# Patient Record
Sex: Female | Born: 1990 | Race: White | Hispanic: No | Smoking: Former smoker
Health system: Southern US, Community
[De-identification: ages and names within clinical notes are randomized; demographics above are authoritative.]

## PROBLEM LIST (undated history)

## (undated) DIAGNOSIS — IMO0002 Reserved for concepts with insufficient information to code with codable children: Secondary | ICD-10-CM

## (undated) DIAGNOSIS — Z8619 Personal history of other infectious and parasitic diseases: Secondary | ICD-10-CM

## (undated) DIAGNOSIS — O34219 Maternal care for unspecified type scar from previous cesarean delivery: Secondary | ICD-10-CM

## (undated) DIAGNOSIS — G43909 Migraine, unspecified, not intractable, without status migrainosus: Secondary | ICD-10-CM

## (undated) DIAGNOSIS — N159 Renal tubulo-interstitial disease, unspecified: Secondary | ICD-10-CM

## (undated) DIAGNOSIS — R87629 Unspecified abnormal cytological findings in specimens from vagina: Secondary | ICD-10-CM

## (undated) HISTORY — DX: Unspecified abnormal cytological findings in specimens from vagina: R87.629

---
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
## 2009-03-16 ENCOUNTER — Emergency Department (HOSPITAL_COMMUNITY): Admission: EM | Admit: 2009-03-16 | Discharge: 2009-03-16 | Payer: Self-pay | Admitting: Emergency Medicine

## 2009-06-29 IMAGING — CR DG CHEST 1V PORT
1 series · 1 of 1 positions shown · non-contrast
Comparison: None.

CLINICAL DATA: Sepsis.  Central venous catheter placement.

PORTABLE CHEST - 1 VIEW [DATE]/6775 8763 hours:

[view not recorded]
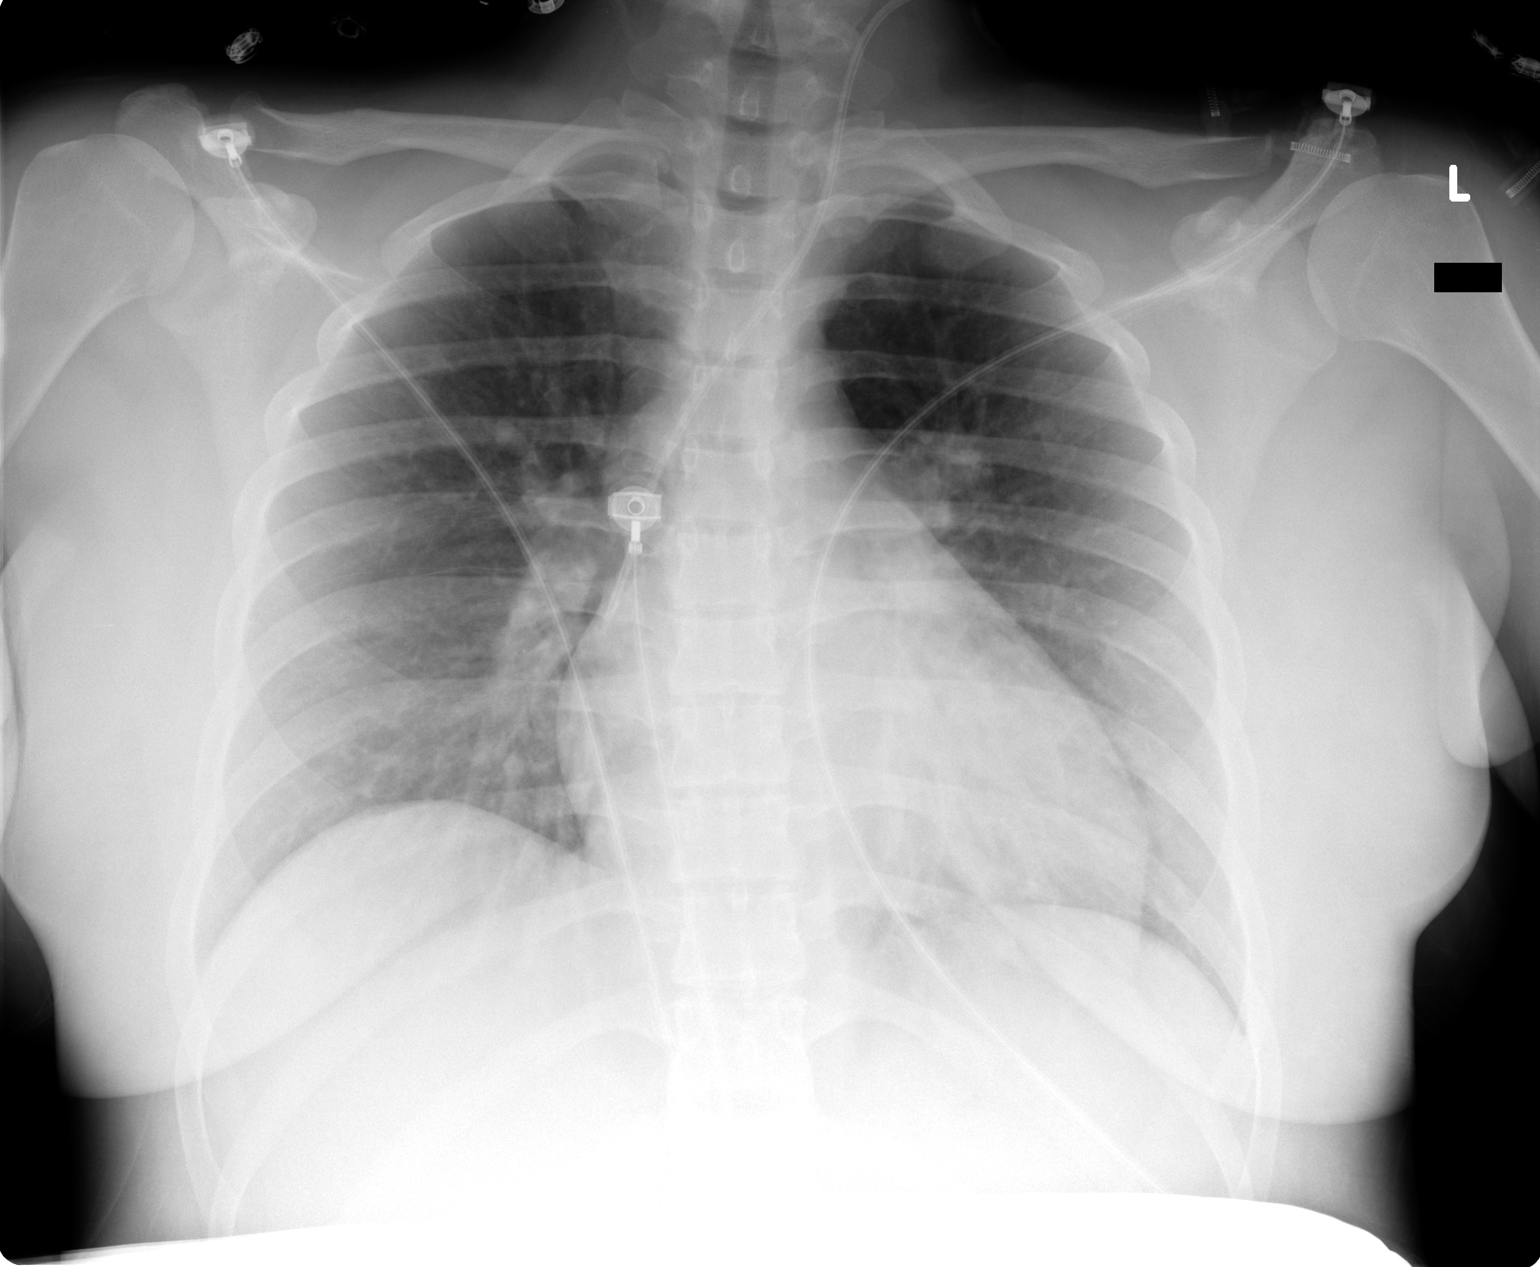

[1 of 1 positions shown; findings below may reference images not displayed]

FINDINGS: Left jugular central venous catheter tip in the lower SVC
near the cavoatrial junction.  No evidence of pneumothorax or
mediastinal hematoma.  Heart size upper normal for AP portable
technique.  Lungs clear.  No pleural effusions.
IMPRESSION: 1.  Left jugular central venous catheter tip in the lower SVC near
the cavoatrial junction.  No acute complicating features.
2.  No acute cardiopulmonary disease.

## 2009-06-29 IMAGING — CR DG CHEST 1V PORT
1 series · 1 of 1 positions shown · non-contrast
Comparison: 04/07/2008 at [DATE] a.m.

CLINICAL DATA: Sepsis.  Central venous catheter placement.

PORTABLE CHEST - 1 VIEW [DATE] p.m.

[view not recorded]
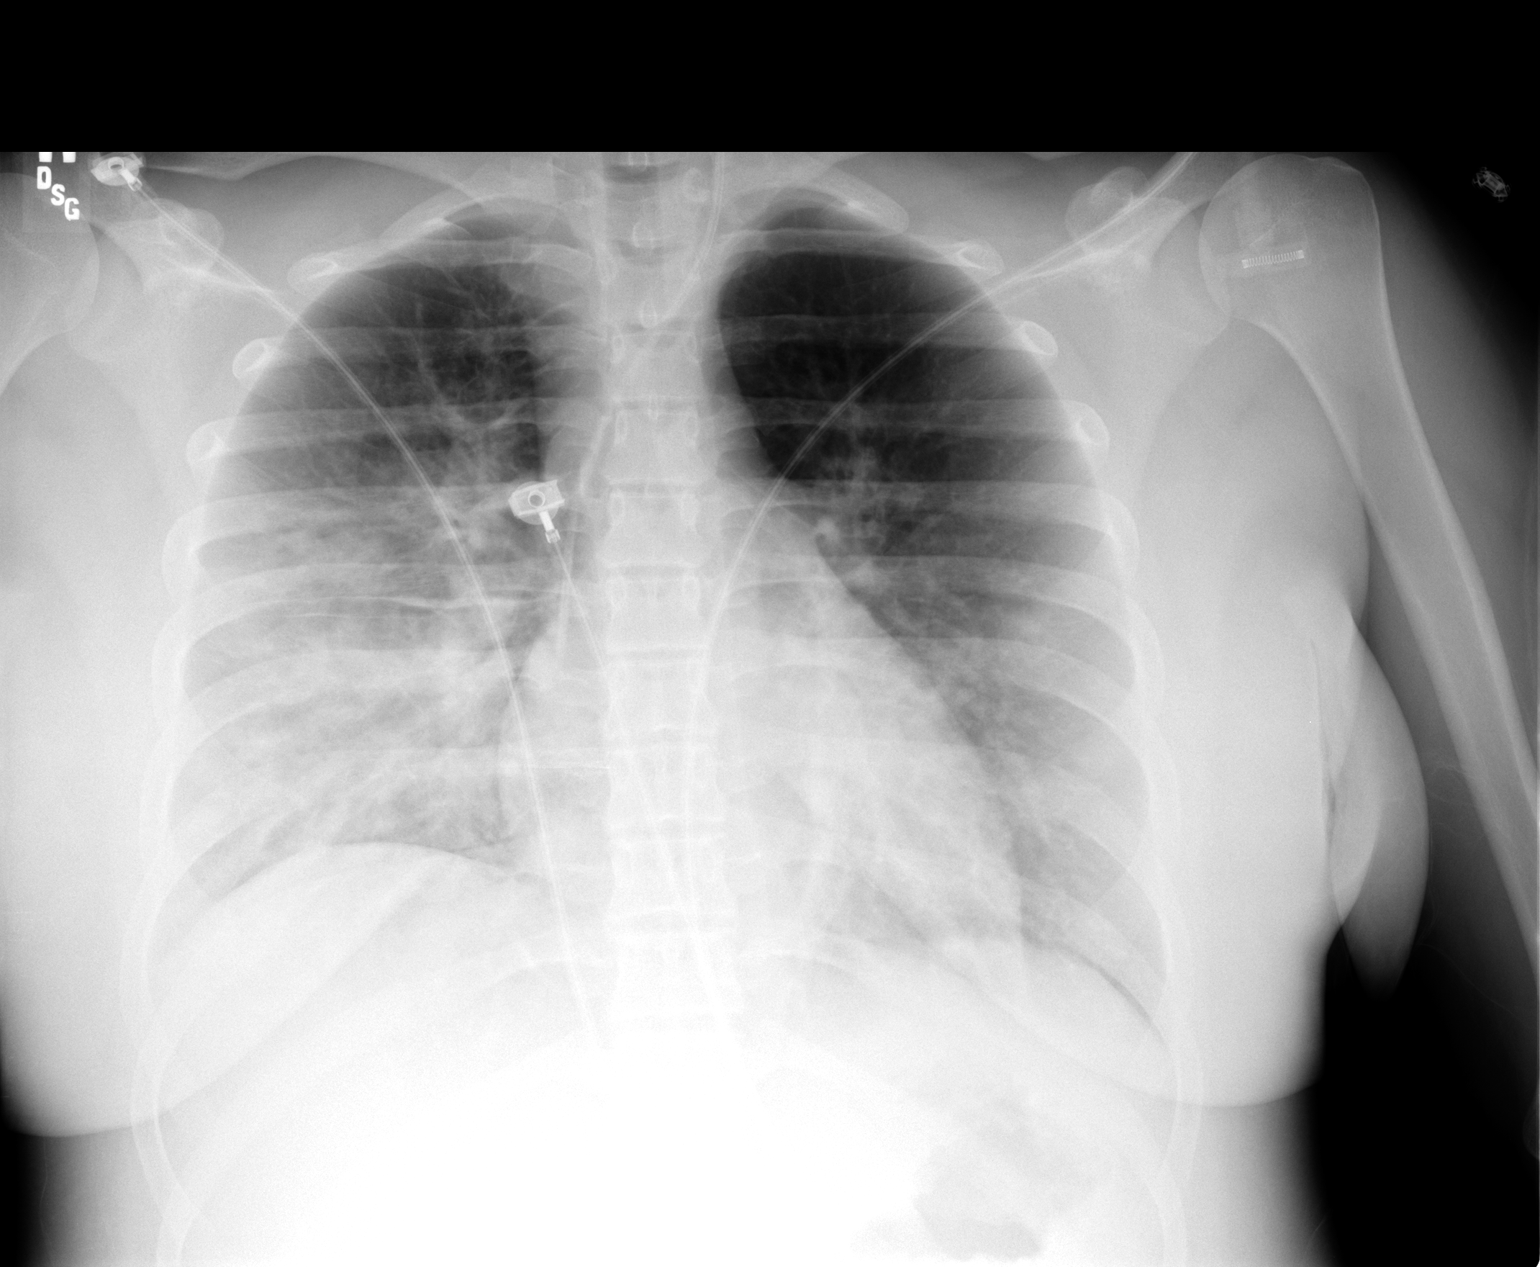

[1 of 1 positions shown; findings below may reference images not displayed]

FINDINGS: The patient has now developed bilateral pneumonia, worse
on the right than the left.  The infiltrates appear to involve
primarily the lower lobes.  Central venous catheter tip is in the
superior vena cava in good position just above the cavoatrial
junction.

The heart size and vascularity are normal.  No bony abnormality.
No pneumothorax.
IMPRESSION: Interval development of bilateral pulmonary infiltrates, probably
pneumonia.  Central venous catheter tip  is just above the
cavoatrial junction.

## 2009-06-29 IMAGING — CR DG CHEST 1V PORT
1 series · 1 of 1 positions shown · non-contrast
Comparison: 04/07/2008 and 8077 hours.

CLINICAL DATA: Fever and shortness of breath.

PORTABLE CHEST - 1 VIEW

[view not recorded]
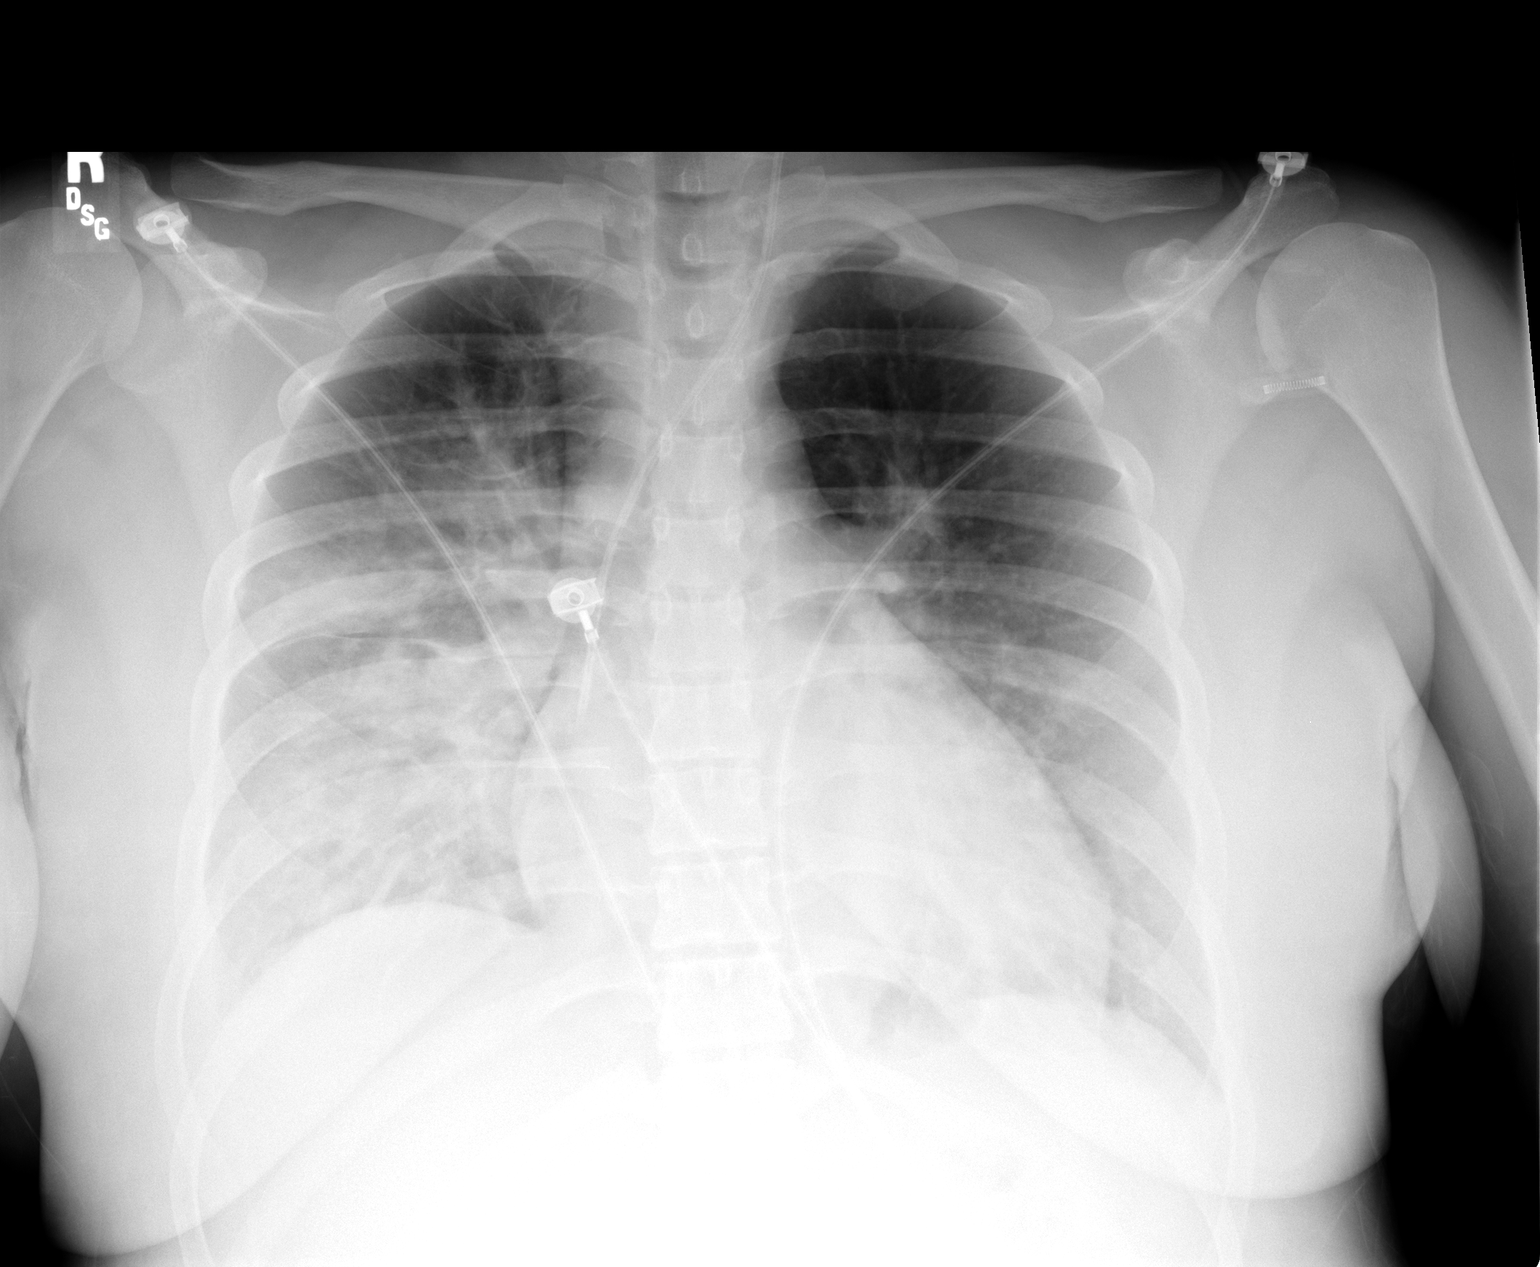

[1 of 1 positions shown; findings below may reference images not displayed]

FINDINGS: The left IJ catheter is stable.  Persistent bilateral
infiltrates.  Probable small right effusion.
IMPRESSION: 1.  Stable chest x-ray.  Persistent bilateral infiltrates and
probable small right effusion.

## 2009-06-30 IMAGING — CR DG CHEST 1V PORT
1 series · 1 of 1 positions shown · non-contrast
Comparison: 04/07/2008

CLINICAL DATA: Fever and headache

PORTABLE CHEST - 1 VIEW

[view not recorded]
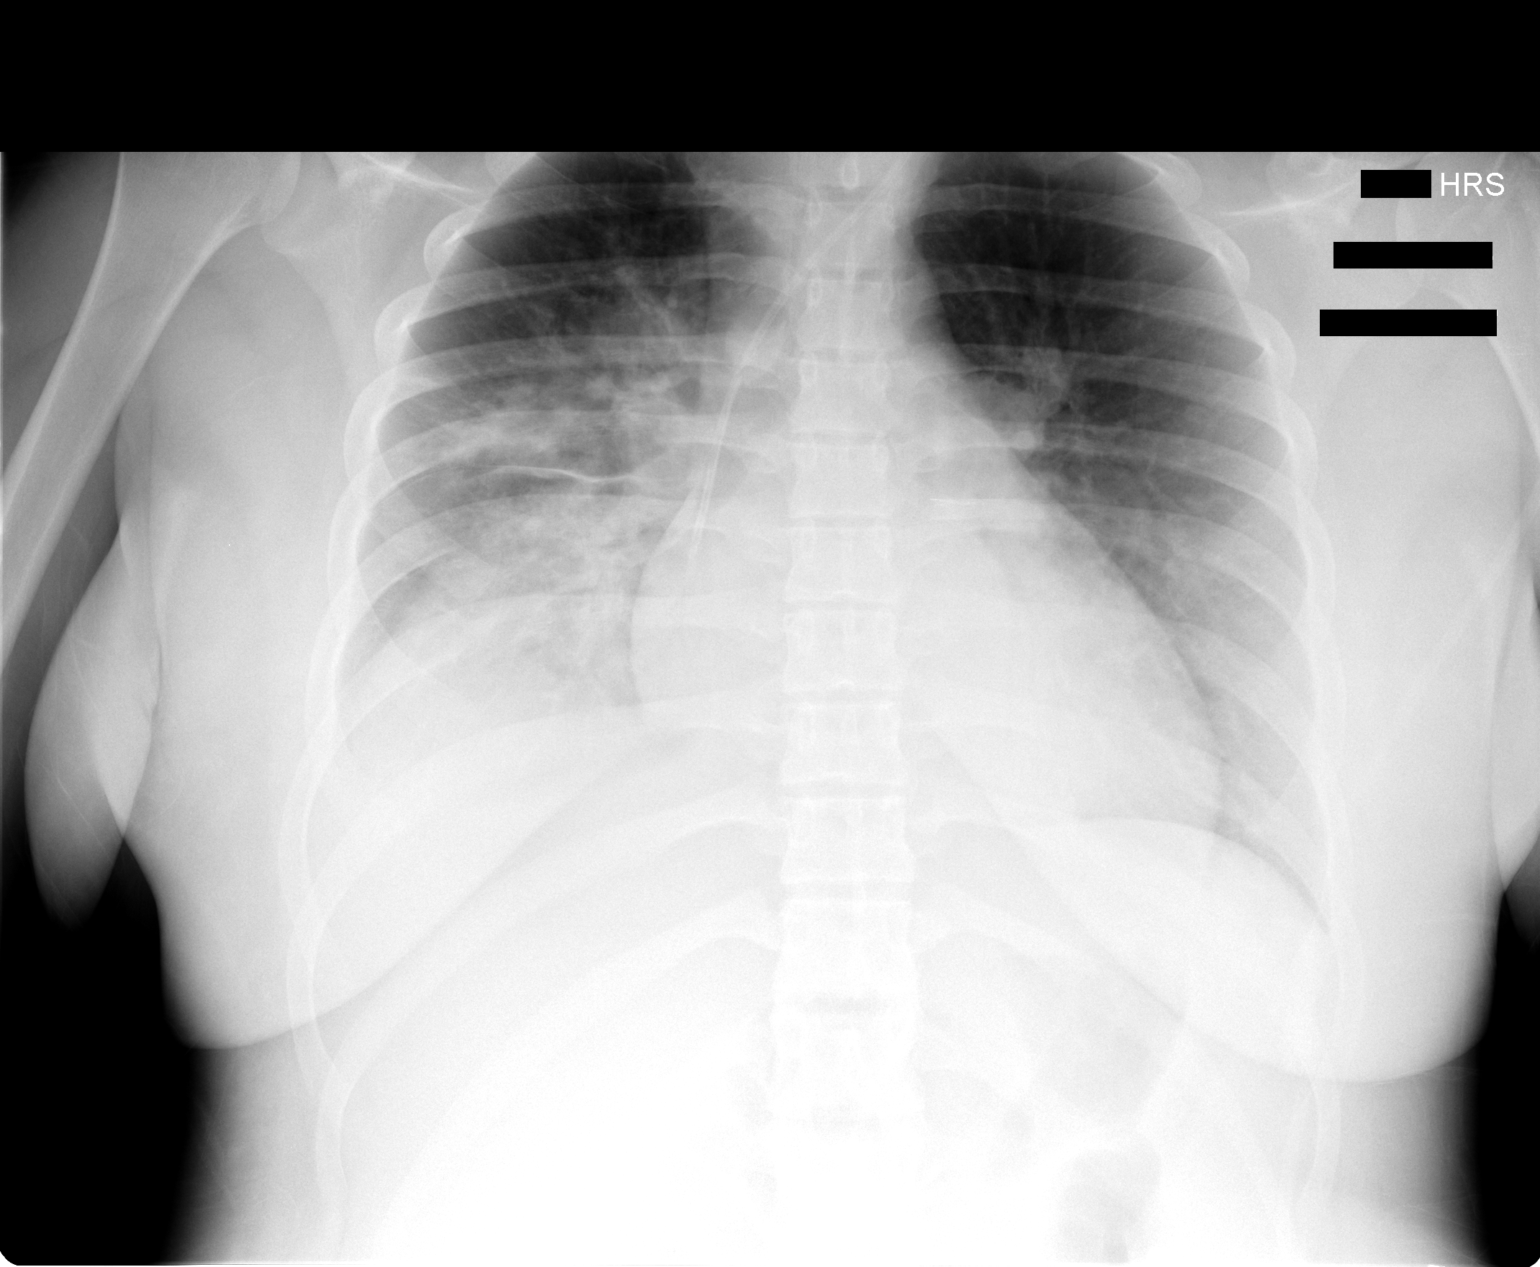

[1 of 1 positions shown; findings below may reference images not displayed]

FINDINGS: Portable upright view of the chest demonstrates airspace
opacification occupying the right middle and lower lung region.
Findings suggestive for pneumonia.  There is a left jugular central
venous catheter with the tip in the SVC.  Left lung is clear.  The
heart and mediastinum are stable.  There is stable fullness in the
right paratracheal region and cannot exclude adenopathy in this
area.  Increased opacification in the right lung base with
obscuration of the right hemidiaphragm. Difficult to exclude
airspace disease in the retrocardiac space.
IMPRESSION: Persistent airspace disease throughout the right mid and lower lung
region.  Increased density in the right lung base and cannot
exclude pleural fluid.

## 2009-10-12 ENCOUNTER — Other Ambulatory Visit: Payer: Self-pay | Admitting: Obstetrics and Gynecology

## 2009-10-16 ENCOUNTER — Inpatient Hospital Stay (HOSPITAL_COMMUNITY): Admission: RE | Admit: 2009-10-16 | Discharge: 2009-10-19 | Payer: Self-pay | Admitting: Obstetrics and Gynecology

## 2009-10-23 ENCOUNTER — Inpatient Hospital Stay (HOSPITAL_COMMUNITY): Admission: AD | Admit: 2009-10-23 | Discharge: 2009-10-23 | Payer: Self-pay | Admitting: Obstetrics and Gynecology

## 2010-07-05 ENCOUNTER — Emergency Department (HOSPITAL_COMMUNITY): Admission: EM | Admit: 2010-07-05 | Discharge: 2010-07-05 | Payer: Self-pay | Admitting: Emergency Medicine

## 2010-12-30 LAB — URINE CULTURE: Colony Count: 60000

## 2010-12-30 LAB — URINE MICROSCOPIC-ADD ON

## 2010-12-30 LAB — CROSSMATCH: ABO/RH(D): O NEG

## 2010-12-30 LAB — URINALYSIS, ROUTINE W REFLEX MICROSCOPIC
Ketones, ur: 15 mg/dL — AB
Nitrite: NEGATIVE
pH: 6 (ref 5.0–8.0)

## 2010-12-30 LAB — CBC
HCT: 26.8 % — ABNORMAL LOW (ref 36.0–46.0)
Hemoglobin: 9.3 g/dL — ABNORMAL LOW (ref 12.0–15.0)
MCHC: 34.7 g/dL (ref 30.0–36.0)
MCV: 86.6 fL (ref 78.0–100.0)
MCV: 87.9 fL (ref 78.0–100.0)
Platelets: 474 10*3/uL — ABNORMAL HIGH (ref 150–400)
RBC: 3.1 MIL/uL — ABNORMAL LOW (ref 3.87–5.11)
WBC: 10.9 10*3/uL — ABNORMAL HIGH (ref 4.0–10.5)
WBC: 11.9 10*3/uL — ABNORMAL HIGH (ref 4.0–10.5)

## 2010-12-30 LAB — RH IMMUNE GLOB WKUP(>/=20WKS)(NOT WOMEN'S HOSP)

## 2010-12-30 LAB — CCBB MATERNAL DONOR DRAW

## 2011-01-14 LAB — RPR: RPR Ser Ql: NONREACTIVE

## 2011-01-14 LAB — CBC
HCT: 31.2 % — ABNORMAL LOW (ref 36.0–46.0)
Platelets: 244 10*3/uL (ref 150–400)
WBC: 10.3 10*3/uL (ref 4.0–10.5)

## 2011-01-21 LAB — POCT URINALYSIS DIP (DEVICE)
Protein, ur: 30 mg/dL — AB
Urobilinogen, UA: 0.2 mg/dL (ref 0.0–1.0)

## 2011-01-21 LAB — POCT RAPID STREP A (OFFICE): Streptococcus, Group A Screen (Direct): NEGATIVE

## 2011-01-21 LAB — POCT INFECTIOUS MONO SCREEN: Mono Screen: NEGATIVE

## 2011-02-26 NOTE — Op Note (Signed)
NAME:  Dana Snyder, Dana Snyder               ACCOUNT NO.:  [id number]   MEDICAL RECORD NO.:  [id number]          PATIENT TYPE:  AMB   LOCATION:  DSC                          FACILITY:  [facility]   PHYSICIAN:  Mila Homer. Sherlean Foot, M.D. DATE OF BIRTH:  01/16/1991   DATE OF PROCEDURE:  07/15/2007  DATE OF DISCHARGE:                               OPERATIVE REPORT   SURGEON:  Mila Homer. Sherlean Foot, M.D.   ASSISTANT:  None.   ANESTHESIA:  General.   PREOPERATIVE DIAGNOSIS:  Right knee internal derangement, probable loose  body.   POSTOPERATIVE DIAGNOSIS:  Right knee plica syndrome.   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  However, she was having mechanical symptoms.  Informed consent was obtained from the mom.   DESCRIPTION OF PROCEDURE:  The patient was laid supine and administered  general LMA anesthesia.  The right leg was prepped and draped in the  usual sterile fashion.  Inferolateral and inferomedial portals were  created with #11 blade, blunt trocar and cannula.  Diagnostic  arthroscopy revealed no chondromalacia in the joint whatsoever.  I took  a tour through all 3 compartments and the only thing that I could find  was some relatively significant medial parapatellar plica, which was  impinging all the way into the patella.  I debrided this and obtained  hemostasis.  I searched in the medial and lateral fat pad for any hard  loose bodies, both under direct visualization through the scope as well  as palpation with a probe and with my finger.  I could not find any  remnant of a loose body.  I went into the back of the knee past the PCL  and did not find anything there either.  I then went into all 3  compartments including the gutters, no loose bodies, no other pathology  other than the plica,  which I had debrided.  I then irrigated and closed  with 4-0 nylon sutures, dressed with a Xeroform dressing sponges,  sterile Webril and Ace wrap, infiltrated with 10 mL of Marcaine and  morphine mixture.           ______________________________  Mila Homer Sherlean Foot, M.D.     SDL/MEDQ  D:  07/15/2007  T:  07/16/2007  Job:  [id number]

## 2011-02-26 NOTE — Discharge Summary (Signed)
NAME:  Dana Snyder, Dana Snyder               ACCOUNT NO.:  [id number]   MEDICAL RECORD NO.:  [id number]           PATIENT TYPE:   LOCATION:                                 FACILITY:   PHYSICIAN:  Tanya S. Shawnie Pons, M.D.   DATE OF BIRTH:  1991/06/13   DATE OF ADMISSION:  04/06/2008  DATE OF DISCHARGE:  04/06/2008                               DISCHARGE SUMMARY   FINAL DIAGNOSES:  1. Intrauterine pregnancy at 24 weeks.  2. Pyelonephritis.  3. Sepsis and acute respiratory distress syndrome.   PERTINENT LABORATORY VALUES:  Urinalysis that showed 100 of protein, 26  leuke esterase with 7 to 10 wbc's, and many bacteria.  White blood cell  count of 23.3 and hemoglobin 11.5.  Electrolytes were essentially  normal.  Urine culture was negative.  Blood cultures were negative x2.  Chest x-ray that showed right-sided airspace disease.  Her x-ray showed  bilateral pulmonary infiltrate from her first x-ray.  Her last x-ray  showed just right side, resolution of the left.   PROCEDURES:  The patient had an A line placed.  She also had external  jugular central line placed.   CONSULTANTS:  Critical care medicine.   Reason for admission, briefly please see the H&P on the chart.   The patient is a 20 year old G1, who is at 24 weeks, who gets [facility].  The patient went to [facility] for fever, who is diagnosed with kidney infection, sent  home on Macrobid.  She was taken approximately 3 doses, when she  developed increasing fever and so she came in at this point to the  hospital.  The patient was admitted and started on ampicillin and  gentamicin because of her allergy to cephalosporins, the patient then  came in to the hospital.   HOSPITAL COURSE:  The patient was started on ampicillin and gentamicin  on April 06, 2008, this is continuing until the next day, when the  patient dropped her blood pressure until the 70 to 40s, at that point  she was transfer to  the ICU and she was switched to Zosyn and  gentamicin.  Critical care medicine was consulted.  She then developed  some difficulty breathing.  Chest x-ray shows as above.  She was treated  with oxygen and put on CPAP and BiPAP, lines were placed, sepsis  protocol was initiated.  After this, the patient became did really well.  She had lots of fluid and got Lasix x1, but then was able to wean off  oxygen.  Her central lines were discontinued.  She was placed on p.o.  medications.  Urine culture was negative and in the absence of that she  should be on bacteriocidal medicine.  She is started on Septra double-  strength 1 p.o. b.i.d. prior to discharge.  The patient was tolerating  this and was on all oral on the day prior to discharge.  The patient had  been afebrile for 3 days in the hospital.   DISCHARGE DISPOSITION AND CONDITION:  The patient is discharged home  in  improved condition.  Followup will be in [facility], Eulah Pont  will call her with this appointment.  She is also instructed to return  for nausea, vomiting, inability to keep down her medications, or repeat  of febrile-like illness.   DISCHARGE MEDICATIONS:  Septra 1 p.o. b.i.d. of the double strength  variety x11 days to complete 14-day course of antibiotics.      Shelbie Proctor. Shawnie Pons, M.D.  Electronically Signed     TSP/MEDQ  D:  04/10/2008  T:  04/10/2008  Job:  [id number]

## 2011-02-26 NOTE — Discharge Summary (Signed)
NAME:  Dana Snyder, Dana Snyder NO.:  [id number]   MEDICAL RECORD NO.:  [id number]           PATIENT TYPE:   LOCATION:                                 FACILITY:   PHYSICIAN:  Lesly Dukes, M.D.      DATE OF BIRTH:   DATE OF ADMISSION:  07/16/2008  DATE OF DISCHARGE:  10/20/2007                               DISCHARGE SUMMARY   REASON FOR HOSPITALIZATION:  Premature rupture of membranes at 58 and 4  weeks.   HOSPITAL COURSE:  The patient is an 20 year old G1, P0, admitted for  premature rupture of membranes at 60 and 4 weeks.  She was admitted to  Labor and Delivery.  Throughout course of labor, she was treated with  antibiotics for PROM greater than 24 hours.  Her labor was augmented  with Cytotec and Pitocin.  The patient delivered at 9:45 on June 17, 2008.  She delivered a viable female by spontaneous vaginal delivery under  epidural anesthesia.  The infant was bulb suctioned at delivery.  Nuchal  cord x1 was present.  It was loose and infant was delivered through.  The patient had a second-degree perineal laceration repaired with 2-0  Vicryl suture.  Estimated blood loss of 300 mL.  The patient and infant  stable.  On July 16, 2008, the patient had a hemoglobin of 10.7 and  hematocrit of 30.4.  The patient's blood type is O negative and baby's  blood type was O positive.  Fetal bleeding negative.  The patient  received 300 mcg of RhoGAM.  The patient is rubella immune, RPR  nonreactive, and HIV negative.  By the day of discharge, the patient's  vital signs were stable.  The patient and the baby will be discharged  with followup with the mother [facility].  The baby  to be seen at [facility] on July 20, 2008, at  2:15.   DISCHARGE MEDICATIONS:  1. Prenatal vitamin take 1 by mouth daily while breastfeeding.  2. Ibuprofen 600 mg 1 by mouth every 6 hours as needed for pain.  3. Colace 100 mg twice a day.  4.  Percocet 5/325 mg take 1 p.o. q.4-6 h. as needed for pain.   DISCHARGE DIAGNOSES:  1. Spontaneous vaginal delivery at 38 and 5 weeks.  2. Premature rupture of membrane.   DISCHARGE CONDITION:  Stable.   FOLLOWUP:  [facility] appointment at the [facility] in 6 weeks.   The patient instructed to have pelvic rest and no heavy lifting for 6  weeks.      Delbert Harness, MD      Lesly Dukes, M.D.  Electronically Signed    KB/MEDQ  D:  07/19/2008  T:  07/20/2008  Job:  [id number]

## 2011-03-07 ENCOUNTER — Emergency Department (HOSPITAL_COMMUNITY)
Admission: EM | Admit: 2011-03-07 | Discharge: 2011-03-07 | Discharge status: Against Medical Advice | Payer: Self-pay | Attending: Emergency Medicine | Admitting: Emergency Medicine

## 2011-03-07 DIAGNOSIS — R51 Headache: Secondary | ICD-10-CM | POA: Insufficient documentation

## 2011-03-07 DIAGNOSIS — R509 Fever, unspecified: Secondary | ICD-10-CM | POA: Insufficient documentation

## 2011-03-07 DIAGNOSIS — R1110 Vomiting, unspecified: Secondary | ICD-10-CM | POA: Insufficient documentation

## 2011-03-07 LAB — RAPID STREP SCREEN (MED CTR MEBANE ONLY): Streptococcus, Group A Screen (Direct): NEGATIVE

## 2011-03-08 ENCOUNTER — Emergency Department (HOSPITAL_COMMUNITY)
Admission: EM | Admit: 2011-03-08 | Discharge: 2011-03-08 | Disposition: A | Discharge status: Home / Self Care | Payer: Self-pay | Attending: Emergency Medicine | Admitting: Emergency Medicine

## 2011-03-08 DIAGNOSIS — J039 Acute tonsillitis, unspecified: Secondary | ICD-10-CM | POA: Insufficient documentation

## 2011-03-08 LAB — DIFFERENTIAL
Basophils Absolute: 0 10*3/uL (ref 0.0–0.1)
Eosinophils Absolute: 0 10*3/uL (ref 0.0–0.7)
Lymphs Abs: 1.5 10*3/uL (ref 0.7–4.0)
Neutro Abs: 12.1 10*3/uL — ABNORMAL HIGH (ref 1.7–7.7)

## 2011-03-08 LAB — CBC
MCH: 30.5 pg (ref 26.0–34.0)
MCHC: 33.9 g/dL (ref 30.0–36.0)
Platelets: 226 10*3/uL (ref 150–400)

## 2011-03-08 LAB — MONONUCLEOSIS SCREEN: Mono Screen: NEGATIVE

## 2011-07-11 LAB — COMPREHENSIVE METABOLIC PANEL
ALT: 15
AST: 15
Albumin: 2.2 — ABNORMAL LOW
Calcium: 8 — ABNORMAL LOW
Chloride: 103
Creatinine, Ser: 0.74
GFR calc Af Amer: >60
Sodium: 132 — ABNORMAL LOW

## 2011-07-11 LAB — DIFFERENTIAL
Basophils Absolute: 0
Basophils Relative: 0
Eosinophils Absolute: 0
Eosinophils Relative: 0
Lymphocytes Relative: 4 — ABNORMAL LOW
Lymphs Abs: 0.7
Monocytes Absolute: 0.8
Monocytes Absolute: 1.1 — ABNORMAL HIGH
Neutro Abs: 14.5 — ABNORMAL HIGH
Neutrophils Relative %: 91 — ABNORMAL HIGH

## 2011-07-11 LAB — BASIC METABOLIC PANEL
BUN: 4 — ABNORMAL LOW
BUN: 6
CO2: 23
CO2: 24
Calcium: 7.6 — ABNORMAL LOW
Calcium: 8.2 — ABNORMAL LOW
Calcium: 9
Chloride: 104
Chloride: 105
Chloride: 108
Creatinine, Ser: 0.71
Creatinine, Ser: 0.76
Creatinine, Ser: 0.78
GFR calc Af Amer: >60
GFR calc Af Amer: >60
GFR calc Af Amer: >60
GFR calc Af Amer: >60
GFR calc non Af Amer: >60
Glucose, Bld: 83
Glucose, Bld: 83
Potassium: 3.5
Potassium: 3.5
Sodium: 133 — ABNORMAL LOW

## 2011-07-11 LAB — CBC
HCT: 25.5 — ABNORMAL LOW
HCT: 26.2 — ABNORMAL LOW
HCT: 33.2 — ABNORMAL LOW
Hemoglobin: 11.5 — ABNORMAL LOW
MCHC: 35.2
MCHC: 35.6
MCV: 91.5
MCV: 92.3
MCV: 93.5
Platelets: 193
Platelets: 193
RBC: 2.66 — ABNORMAL LOW
RBC: 2.78 — ABNORMAL LOW
RBC: 2.84 — ABNORMAL LOW
RBC: 3.58 — ABNORMAL LOW
RDW: 12.6
RDW: 12.8
WBC: 13.5 — ABNORMAL HIGH
WBC: 17 — ABNORMAL HIGH

## 2011-07-11 LAB — LEGIONELLA ANTIGEN, URINE: Legionella Antigen, Urine: NEGATIVE

## 2011-07-11 LAB — PROTIME-INR
INR: 1.1
Prothrombin Time: 14.8

## 2011-07-11 LAB — URINALYSIS, ROUTINE W REFLEX MICROSCOPIC
Ketones, ur: >80 — AB
Nitrite: NEGATIVE
pH: 5.5

## 2011-07-11 LAB — URINE CULTURE: Colony Count: >=100000

## 2011-07-11 LAB — CULTURE, BLOOD (ROUTINE X 2): Culture: NO GROWTH

## 2011-07-11 LAB — BLOOD GAS, ARTERIAL
Bicarbonate: 18.9 — ABNORMAL LOW
O2 Saturation: 99
TCO2: 19.7

## 2011-07-11 LAB — APTT: aPTT: 33

## 2011-07-11 LAB — CARBOXYHEMOGLOBIN
Carboxyhemoglobin: 0.8
Carboxyhemoglobin: 1.2
Methemoglobin: 0.8
O2 Saturation: 86

## 2011-07-11 LAB — MAGNESIUM: Magnesium: 1.8

## 2011-07-11 LAB — URINE MICROSCOPIC-ADD ON

## 2011-07-11 LAB — STREP PNEUMONIAE URINARY ANTIGEN: Strep Pneumo Urinary Antigen: NEGATIVE

## 2011-07-11 LAB — ABO/RH: ABO/RH(D): O NEG

## 2011-07-12 LAB — POCT URINALYSIS DIP (DEVICE)
Hgb urine dipstick: NEGATIVE
Operator id: 297281
Operator id: 297281
Protein, ur: NEGATIVE
Protein, ur: NEGATIVE
Specific Gravity, Urine: 1.01
Specific Gravity, Urine: 1.01
Urobilinogen, UA: 0.2
Urobilinogen, UA: 0.2
pH: 7
pH: 6

## 2011-07-15 LAB — RH IMMUNE GLOB WKUP(>/=20WKS)(NOT WOMEN'S HOSP): Fetal Screen: NEGATIVE

## 2011-07-15 LAB — CBC
HCT: 30.4 — ABNORMAL LOW
Hemoglobin: 11.1 — ABNORMAL LOW
MCHC: 35.3
MCV: 88.4
Platelets: 224
Platelets: 426 — ABNORMAL HIGH
RBC: 3.44 — ABNORMAL LOW
RDW: 13.3

## 2011-07-15 LAB — URINALYSIS, ROUTINE W REFLEX MICROSCOPIC
Nitrite: NEGATIVE
Specific Gravity, Urine: 1.025
pH: 6

## 2011-07-15 LAB — URINE CULTURE: Colony Count: >=100000

## 2011-07-15 LAB — GC/CHLAMYDIA PROBE AMP, URINE
Chlamydia, Swab/Urine, PCR: NEGATIVE
GC Probe Amp, Urine: NEGATIVE

## 2011-07-15 LAB — POCT URINALYSIS DIP (DEVICE)
Hgb urine dipstick: NEGATIVE
Hgb urine dipstick: NEGATIVE
Hgb urine dipstick: NEGATIVE
Ketones, ur: NEGATIVE
Ketones, ur: NEGATIVE
Ketones, ur: NEGATIVE
Operator id: 297281
Protein, ur: NEGATIVE
Protein, ur: NEGATIVE
Protein, ur: NEGATIVE
Specific Gravity, Urine: 1.01
Specific Gravity, Urine: <=1.005
Specific Gravity, Urine: 1.015
Urobilinogen, UA: 0.2
Urobilinogen, UA: 1
Urobilinogen, UA: 0.2
pH: 6.5

## 2011-07-15 LAB — RPR: RPR Ser Ql: NONREACTIVE

## 2011-07-15 LAB — URINE MICROSCOPIC-ADD ON

## 2011-07-15 LAB — DIFFERENTIAL
Basophils Absolute: 0.1
Lymphocytes Relative: 29
Neutro Abs: 5.5

## 2011-07-17 LAB — POCT URINALYSIS DIP (DEVICE)
Nitrite: NEGATIVE
Protein, ur: NEGATIVE
pH: 6.5

## 2011-07-22 ENCOUNTER — Emergency Department (HOSPITAL_COMMUNITY)
Admission: EM | Admit: 2011-07-22 | Discharge: 2011-07-22 | Disposition: A | Discharge status: Home / Self Care | Payer: Self-pay | Attending: Emergency Medicine | Admitting: Emergency Medicine

## 2011-07-22 DIAGNOSIS — L0211 Cutaneous abscess of neck: Secondary | ICD-10-CM | POA: Insufficient documentation

## 2011-07-25 LAB — CULTURE, ROUTINE-ABSCESS: Gram Stain: NONE SEEN

## 2011-07-25 LAB — POCT HEMOGLOBIN-HEMACUE
Hemoglobin: 14.4
Operator id: 116011

## 2011-11-12 ENCOUNTER — Emergency Department (HOSPITAL_COMMUNITY)
Admission: EM | Admit: 2011-11-12 | Discharge: 2011-11-13 | Disposition: A | Discharge status: Home / Self Care | Payer: Self-pay | Attending: Emergency Medicine | Admitting: Emergency Medicine

## 2011-11-12 ENCOUNTER — Encounter (HOSPITAL_COMMUNITY): Payer: Self-pay | Admitting: *Deleted

## 2011-11-12 DIAGNOSIS — R1011 Right upper quadrant pain: Secondary | ICD-10-CM | POA: Insufficient documentation

## 2011-11-12 DIAGNOSIS — R350 Frequency of micturition: Secondary | ICD-10-CM | POA: Insufficient documentation

## 2011-11-12 DIAGNOSIS — N12 Tubulo-interstitial nephritis, not specified as acute or chronic: Secondary | ICD-10-CM | POA: Insufficient documentation

## 2011-11-12 DIAGNOSIS — R109 Unspecified abdominal pain: Secondary | ICD-10-CM | POA: Insufficient documentation

## 2011-11-12 DIAGNOSIS — M25519 Pain in unspecified shoulder: Secondary | ICD-10-CM | POA: Insufficient documentation

## 2011-11-12 DIAGNOSIS — R110 Nausea: Secondary | ICD-10-CM | POA: Insufficient documentation

## 2011-11-12 DIAGNOSIS — F17200 Nicotine dependence, unspecified, uncomplicated: Secondary | ICD-10-CM | POA: Insufficient documentation

## 2011-11-12 NOTE — ED Notes (Signed)
rlq pain since Thursday and it radistes into her rt shoulder.  She was ill with the flu one week previously.  lmp now

## 2011-11-13 ENCOUNTER — Emergency Department (HOSPITAL_COMMUNITY): Payer: Self-pay

## 2011-11-13 ENCOUNTER — Encounter (HOSPITAL_COMMUNITY): Payer: Self-pay | Admitting: *Deleted

## 2011-11-13 LAB — CBC
HCT: 32.6 % — ABNORMAL LOW (ref 36.0–46.0)
Hemoglobin: 11.2 g/dL — ABNORMAL LOW (ref 12.0–15.0)
MCV: 89.6 fL (ref 78.0–100.0)
RBC: 3.64 MIL/uL — ABNORMAL LOW (ref 3.87–5.11)
RDW: 12.1 % (ref 11.5–15.5)
WBC: 11.2 10*3/uL — ABNORMAL HIGH (ref 4.0–10.5)

## 2011-11-13 LAB — LIPASE, BLOOD: Lipase: 11 U/L (ref 11–59)

## 2011-11-13 LAB — URINALYSIS, ROUTINE W REFLEX MICROSCOPIC
Ketones, ur: NEGATIVE mg/dL
Protein, ur: NEGATIVE mg/dL
Urobilinogen, UA: 0.2 mg/dL (ref 0.0–1.0)

## 2011-11-13 LAB — COMPREHENSIVE METABOLIC PANEL
Alkaline Phosphatase: 49 U/L (ref 39–117)
BUN: 8 mg/dL (ref 6–23)
CO2: 25 mEq/L (ref 19–32)
Chloride: 101 mEq/L (ref 96–112)
Creatinine, Ser: 0.89 mg/dL (ref 0.50–1.10)
GFR calc Af Amer: >90 mL/min (ref >90)
GFR calc non Af Amer: >90 mL/min (ref >90)
Glucose, Bld: 101 mg/dL — ABNORMAL HIGH (ref 70–99)
Potassium: 3.7 mEq/L (ref 3.5–5.1)
Total Bilirubin: 0.3 mg/dL (ref 0.3–1.2)

## 2011-11-13 LAB — URINE MICROSCOPIC-ADD ON

## 2011-11-13 MED ORDER — OXYCODONE-ACETAMINOPHEN 5-325 MG PO TABS: 2.0000 | ORAL_TABLET | ORAL | Status: AC | PRN

## 2011-11-13 MED ORDER — KETOROLAC TROMETHAMINE 30 MG/ML IJ SOLN
30.0000 mg | Freq: Once | INTRAMUSCULAR | Status: AC
  Administered 2011-11-13: 30 mg via INTRAVENOUS
  Filled 2011-11-13: qty 1

## 2011-11-13 MED ORDER — SODIUM CHLORIDE 0.9 % IV BOLUS (SEPSIS)
1000.0000 mL | Freq: Once | INTRAVENOUS | Status: AC
  Administered 2011-11-13: 1000 mL via INTRAVENOUS

## 2011-11-13 MED ORDER — CEPHALEXIN 500 MG PO CAPS: 500.0000 mg | ORAL_CAPSULE | Freq: Four times a day (QID) | ORAL | Status: AC

## 2011-11-13 MED ORDER — PROMETHAZINE HCL 25 MG PO TABS: 25.0000 mg | ORAL_TABLET | Freq: Four times a day (QID) | ORAL | Status: AC | PRN

## 2011-11-13 MED ORDER — DEXTROSE 5 % IV SOLN
1.0000 g | INTRAVENOUS | Status: DC
  Administered 2011-11-13: 1 g via INTRAVENOUS
  Filled 2011-11-13: qty 10

## 2011-11-13 MED ORDER — MORPHINE SULFATE 4 MG/ML IJ SOLN
4.0000 mg | Freq: Once | INTRAMUSCULAR | Status: AC
  Administered 2011-11-13: 4 mg via INTRAVENOUS
  Filled 2011-11-13: qty 1

## 2011-11-13 MED ORDER — ONDANSETRON HCL 4 MG/2ML IJ SOLN
4.0000 mg | Freq: Once | INTRAMUSCULAR | Status: AC
  Administered 2011-11-13: 4 mg via INTRAVENOUS
  Filled 2011-11-13: qty 2

## 2011-11-13 MED ORDER — HYDROMORPHONE HCL PF 1 MG/ML IJ SOLN
1.0000 mg | Freq: Once | INTRAMUSCULAR | Status: AC
  Administered 2011-11-13: 1 mg via INTRAVENOUS
  Filled 2011-11-13: qty 1

## 2011-11-13 NOTE — ED Notes (Signed)
Pt in US

## 2011-11-13 NOTE — ED Notes (Addendum)
Pt states that she has been having r upper and lower quadrant pain. Pt stated that the pain started Thursday night. Pain level is 8 out of 10. Previously nauseated but currently none. LBM 11/12/11. Bowel Sounds present. No frequency, burining, or itching with urination

## 2011-11-13 NOTE — ED Provider Notes (Signed)
History     CSN: [id number]  Arrival date & time 11/12/11  2239   First MD Initiated Contact with Patient 11/13/11 0036      Chief Complaint  Patient presents with  . Abdominal Pain    (Consider location/radiation/quality/duration/timing/severity/associated sxs/prior treatment) Patient is a 21 y.o. female presenting with abdominal pain. The history is provided by the patient.  Abdominal Pain The primary symptoms of the illness include abdominal pain.   the patient reports development of right upper quadrant abdominal pain as well as right flank pain several days ago.  The pain now radiates into her right shoulder.  She did have flulike illness approximately one week ago.  She reports nausea without vomiting.  She's currently on her menstrual cycle.  She does have urinary frequency.  She denies dysuria and urinary retention.  She has no prior history of ureteral lithiasis.  There is no radiation of her pain into her lower abdomen.  She does not drink alcohol not daily basis.  She reports she's had a history of urinary tract infection and kidney infections before.  She has no prior history of gallstones or biliary colic.  Her pain is not worsened by food.  Her pain is constant.  It is worsened by movement and palpation.  She reports anorexia with decreased by mouth intake.  History reviewed. No pertinent past medical history.  History reviewed. No pertinent past surgical history.  History reviewed. No pertinent family history.  History  Substance Use Topics  . Smoking status: Current Everyday Smoker  . Smokeless tobacco: Not on file  . Alcohol Use: Yes    OB History    Grav Para Term Preterm Abortions TAB SAB Ect Mult Living                  Review of Systems  Gastrointestinal: Positive for abdominal pain.  All other systems reviewed and are negative.    Allergies  Darvocet  Home Medications  No current outpatient prescriptions on file.  BP 98/66  Pulse 84   Temp(Src) 98.2 F (36.8 C) (Oral)  Resp 16  SpO2 99%  LMP 11/12/2011  Physical Exam  Nursing note and vitals reviewed. Constitutional: She is oriented to person, place, and time. She appears well-developed and well-nourished. No distress.  HENT:  Head: Normocephalic and atraumatic.  Eyes: EOM are normal.  Neck: Normal range of motion.  Cardiovascular: Normal rate, regular rhythm and normal heart sounds.   Pulmonary/Chest: Effort normal and breath sounds normal.  Abdominal: Soft. She exhibits no distension.       Tenderness in right upper quadrant.  Genitourinary:       Tenderness of right flank with right CVA tenderness.  No rash  Musculoskeletal: Normal range of motion.  Neurological: She is alert and oriented to person, place, and time.  Skin: Skin is warm and dry.  Psychiatric: She has a normal mood and affect. Judgment normal.    ED Course  Procedures (including critical care time)  Labs Reviewed  URINALYSIS, ROUTINE W REFLEX MICROSCOPIC - Abnormal; Notable for the following:    Color, Urine AMBER (*) BIOCHEMICALS MAY BE AFFECTED BY COLOR   APPearance CLOUDY (*)    Hgb urine dipstick LARGE (*)    Leukocytes, UA MODERATE (*)    All other components within normal limits  URINE MICROSCOPIC-ADD ON - Abnormal; Notable for the following:    Squamous Epithelial / LPF FEW (*) RARE   Casts GRANULAR CAST (*)    All  other components within normal limits  CBC - Abnormal; Notable for the following:    WBC 11.2 (*)    RBC 3.64 (*)    Hemoglobin 11.2 (*)    HCT 32.6 (*)    All other components within normal limits  COMPREHENSIVE METABOLIC PANEL - Abnormal; Notable for the following:    Glucose, Bld 101 (*)    Albumin 3.1 (*)    All other components within normal limits  POCT PREGNANCY, URINE  LIPASE, BLOOD  URINE CULTURE   Dg Chest 2 View  11/13/2011  *RADIOLOGY REPORT*  Clinical Data: Right upper and lower quadrant pain.  CHEST - 2 VIEW  Comparison: 04/09/2008  Findings:  Slight shallow inspiration with elevation of the right hemidiaphragm.  Normal heart size and pulmonary vascularity.  No focal airspace consolidation.  Right lower lung consolidation seen previously has resolved.  No pleural effusion.  No pneumothorax.  IMPRESSION: No evidence of active pulmonary disease.  Original Report Authenticated By: Marlon Pel, M.D.   US Abdomen Complete  11/13/2011  *RADIOLOGY REPORT*  Clinical Data:  Right upper quadrant pain.  Elevated white cell count.  COMPLETE ABDOMINAL ULTRASOUND  Comparison:  04/07/2008  Findings:  Gallbladder:  No evidence of:  The lithiasis or gallbladder sludge. No gallbladder wall thickening or pericholecystic edema.  Murphy's sign is positive.  Common bile duct:  No bile duct dilatation.  Bile duct diameter measured at 2.7 mm.  Liver:  Mild diffuse coarsening of liver echotexture with suggestion of nodular liver contour.  Changes may suggest cirrhosis or fatty infiltration.  Limited color flow Doppler images demonstrate appropriate flow direction in the main portal vein.  IVC:  Appears normal.  Pancreas:  Limited visualization of the pancreas due to overlying bowel gas.  Visualized portions of the head and body are unremarkable.  Spleen:  Spleen length measures 10.7 cm.  Normal homogeneous parenchymal echotexture.  Right Kidney:  Right kidney measures 10.1 cm length.  No hydronephrosis.  Left Kidney:  Left kidney measures 10.2 cm length.  No hydronephrosis.  Lower pole is not visualized due to overlying bowel gas.  Abdominal aorta:  No aneurysm identified.  IMPRESSION: Normal appearance of the gallbladder but positive Murphy's sign. Coarsening of liver echotexture suggesting fatty infiltration or cirrhosis.  Original Report Authenticated By: Marlon Pel, M.D.   I personally reviewed the ultrasound and the xray  1. Pyelonephritis       MDM  The patient feels much better at this time.  I suspect right sided pyelonephritis.  She was tender  in the right upper abdomen a small per son was obtained demonstrating no evidence of cholecystitis.  She has no hydronephrosis.  His no gallstones present.  Her laboratory studies are without significant abnormality.  A urine culture was obtained.  Rocephin was given.  The patient is stating emergency department for proximal 7 hours.  She's feeling much better and is ready to be discharged home.        Lyanne Co, MD 11/13/11 [phone]

## 2011-11-14 LAB — URINE CULTURE
Colony Count: NO GROWTH
Culture: NO GROWTH

## 2012-03-19 ENCOUNTER — Emergency Department (HOSPITAL_COMMUNITY)
Admission: EM | Admit: 2012-03-19 | Discharge: 2012-03-19 | Disposition: A | Discharge status: Home Health | Payer: Self-pay | Attending: Emergency Medicine | Admitting: Emergency Medicine

## 2012-03-19 ENCOUNTER — Emergency Department (HOSPITAL_COMMUNITY): Payer: Self-pay

## 2012-03-19 ENCOUNTER — Encounter (HOSPITAL_COMMUNITY): Payer: Self-pay

## 2012-03-19 ENCOUNTER — Encounter (HOSPITAL_COMMUNITY): Payer: Self-pay | Admitting: Family Medicine

## 2012-03-19 ENCOUNTER — Emergency Department (INDEPENDENT_AMBULATORY_CARE_PROVIDER_SITE_OTHER)
Admission: EM | Admit: 2012-03-19 | Discharge: 2012-03-19 | Disposition: A | Discharge status: Other Acute Inpatient Hospital | Payer: Self-pay | Source: Home / Self Care | Attending: Emergency Medicine | Admitting: Emergency Medicine

## 2012-03-19 ENCOUNTER — Emergency Department (HOSPITAL_COMMUNITY)
Admission: EM | Admit: 2012-03-19 | Discharge: 2012-03-19 | Disposition: A | Discharge status: Home / Self Care | Payer: Self-pay | Attending: Emergency Medicine | Admitting: Emergency Medicine

## 2012-03-19 ENCOUNTER — Encounter (HOSPITAL_COMMUNITY): Payer: Self-pay | Admitting: *Deleted

## 2012-03-19 DIAGNOSIS — R51 Headache: Secondary | ICD-10-CM | POA: Insufficient documentation

## 2012-03-19 DIAGNOSIS — M549 Dorsalgia, unspecified: Secondary | ICD-10-CM | POA: Insufficient documentation

## 2012-03-19 DIAGNOSIS — H539 Unspecified visual disturbance: Secondary | ICD-10-CM | POA: Insufficient documentation

## 2012-03-19 HISTORY — DX: Migraine, unspecified, not intractable, without status migrainosus: G43.909

## 2012-03-19 HISTORY — DX: Renal tubulo-interstitial disease, unspecified: N15.9

## 2012-03-19 MED ORDER — DIPHENHYDRAMINE HCL 50 MG/ML IJ SOLN
25.0000 mg | Freq: Once | INTRAMUSCULAR | Status: AC
  Administered 2012-03-19: 25 mg via INTRAVENOUS
  Filled 2012-03-19: qty 1

## 2012-03-19 MED ORDER — SODIUM CHLORIDE 0.9 % IV BOLUS (SEPSIS)
1000.0000 mL | Freq: Once | INTRAVENOUS | Status: AC
  Administered 2012-03-19: 1000 mL via INTRAVENOUS

## 2012-03-19 MED ORDER — MAGNESIUM SULFATE 50 % IJ SOLN: 2.0000 g | Freq: Once | INTRAMUSCULAR | Status: DC

## 2012-03-19 MED ORDER — KETOROLAC TROMETHAMINE 30 MG/ML IJ SOLN
30.0000 mg | Freq: Once | INTRAMUSCULAR | Status: AC
  Administered 2012-03-19: 30 mg via INTRAVENOUS
  Filled 2012-03-19: qty 1

## 2012-03-19 MED ORDER — DEXAMETHASONE SODIUM PHOSPHATE 4 MG/ML IJ SOLN: 10.0000 mg | Freq: Once | INTRAMUSCULAR | Status: AC

## 2012-03-19 MED ORDER — MAGNESIUM SULFATE 40 MG/ML IJ SOLN
2.0000 g | Freq: Once | INTRAMUSCULAR | Status: DC
  Filled 2012-03-19: qty 50

## 2012-03-19 MED ORDER — DIPHENHYDRAMINE HCL 50 MG/ML IJ SOLN
25.0000 mg | Freq: Once | INTRAMUSCULAR | Status: AC
Start: 2012-03-19 — End: 2012-03-19
  Administered 2012-03-19: 25 mg via INTRAVENOUS
  Filled 2012-03-19: qty 1

## 2012-03-19 MED ORDER — METOCLOPRAMIDE HCL 5 MG/ML IJ SOLN
10.0000 mg | Freq: Once | INTRAMUSCULAR | Status: AC
  Administered 2012-03-19: 10 mg via INTRAVENOUS
  Filled 2012-03-19: qty 2

## 2012-03-19 MED ORDER — HYDROCODONE-ACETAMINOPHEN 5-325 MG PO TABS: 1.0000 | ORAL_TABLET | Freq: Four times a day (QID) | ORAL | Status: AC | PRN

## 2012-03-19 MED ORDER — DEXAMETHASONE SODIUM PHOSPHATE 10 MG/ML IJ SOLN
INTRAMUSCULAR | Status: AC
  Administered 2012-03-19: 10 mg
  Filled 2012-03-19: qty 1

## 2012-03-19 NOTE — ED Notes (Signed)
Patient states she has had a headache for the past couple of days. Pain radiates down spinal cord. History of migraines but states pain is unlike migraine pain.  Denies n/v/d. Has photophobia.

## 2012-03-19 NOTE — ED Notes (Signed)
Pt from [facility]

## 2012-03-19 NOTE — ED Provider Notes (Cosign Needed)
History   This chart was scribed for Benny Lennert, MD by Charolett Bumpers . The patient was seen in room STRE3/STRE3.    CSN: [id number]  Arrival date & time 03/19/12  1718   First MD Initiated Contact with Patient 03/19/12 1737      Chief Complaint  Patient presents with  . Headache    (Consider location/radiation/quality/duration/timing/severity/associated sxs/prior treatment) HPI Comments: Patient states that she has had a constant, severe headache that starts at her temples and radiates to neck for the past 3 days. Patient denies n/v. Patient denies any fever. Patient reports a h/o migraines, but states that her current headache is worse and not the same as her chronic migraines. Patient also complains of photophobia. Patient states that any sort of movement sends sharp shooting pains down her neck. Patient was seen yesterday at [facility] for the same complaint and received medications in injection form with moderate relief for a short time afterwards but states the headache returned.   Patient is a 21 y.o. female presenting with headaches. The history is provided by the patient.  Headache  This is a new problem. The current episode started more than 2 days ago. The problem occurs constantly. The problem has been gradually worsening. The pain is located in the temporal region. The quality of the pain is described as throbbing and sharp. The pain is severe. The pain radiates to the left neck and right neck. Pertinent negatives include no fever, no shortness of breath, no nausea and no vomiting. She has tried ketorolac injections for the symptoms. The treatment provided moderate relief.    Past Medical History  Diagnosis Date  . Migraines   . Kidney infection     Past Surgical History  Procedure Date  . Knee surgery     No family history on file.  History  Substance Use Topics  . Smoking status: Never Smoker   . Smokeless tobacco: Not on file  . Alcohol Use: Yes   Occasional     OB History    Grav Para Term Preterm Abortions TAB SAB Ect Mult Living                  Review of Systems  Constitutional: Negative for fever and chills.  Eyes: Positive for visual disturbance.  Respiratory: Negative for shortness of breath.   Gastrointestinal: Negative for nausea and vomiting.  Skin: Negative for rash.  Neurological: Positive for headaches. Negative for syncope, weakness and numbness.  All other systems reviewed and are negative.    Allergies  Darvocet  Home Medications  No current outpatient prescriptions on file.  BP 109/62  Pulse 71  Temp(Src) 98.6 F (37 C) (Oral)  Resp 20  SpO2 98%  Physical Exam  Constitutional: She is oriented to person, place, and time. She appears well-developed.  HENT:  Head: Normocephalic and atraumatic.       TM's normal bilaterally. Tenderness to left forehead.   Eyes: Conjunctivae and EOM are normal. Pupils are equal, round, and reactive to light. No scleral icterus.  Neck: Neck supple. No thyromegaly present.       Tenderness to posterior neck.   Cardiovascular: Normal rate and regular rhythm.  Exam reveals no gallop and no friction rub.   No murmur heard. Pulmonary/Chest: No stridor. She has no wheezes. She has no rales. She exhibits no tenderness.  Abdominal: She exhibits no distension. There is no tenderness. There is no rebound.  Musculoskeletal: Normal range of motion. She exhibits  no edema.  Lymphadenopathy:    She has no cervical adenopathy.  Neurological: She is oriented to person, place, and time. Coordination normal.  Skin: No rash noted. No erythema.  Psychiatric: She has a normal mood and affect. Her behavior is normal.    ED Course  Procedures (including critical care time)  DIAGNOSTIC STUDIES: Oxygen Saturation is 98% on room air, normal by my interpretation.    COORDINATION OF CARE:  1743: Discussed planned course of treatment with the patient, who is agreeable at this time.  Will start the patient on pain medication for her headache, and order a head CT. Patient is agreeable.  1745: Medication Orders: Ketorolac (Toradol) 30 mg/mL injection 30 mg-once; Metoclopramide (Reglan) injection 10 mg-once; Diphenhydramine (Benadryl) injection 25 mg-once.  1910: Recheck: Informed patient of imaging results. Patient feeling improved. Discussed f/u with PCP and strict return precautions. Will d/c with pain medication.   Labs Reviewed - No data to display Ct Head Wo Contrast  03/19/2012  *RADIOLOGY REPORT*  Clinical Data:  Migraine headache for 3 days, visual changes  CT HEAD WITHOUT CONTRAST  Technique:  Contiguous axial images were obtained from the base of the skull through the vertex without contrast.  Comparison: None  Findings: Scattered motion artifacts, for which repeat imaging was performed. Normal ventricular morphology. No midline shift or mass effect. Normal appearance of brain parenchyma. No intracranial hemorrhage, mass lesion, or evidence of acute infarction. No extra-axial fluid collection. Visualized paranasal sinuses mastoid air cells clear. No acute osseous findings.  IMPRESSION: Normal cm.  Original Report Authenticated By: Lollie Marrow, M.D.     No diagnosis found.    MDM     The chart was scribed for me under my direct supervision.  I personally performed the history, physical, and medical decision making and all procedures in the evaluation of this patient.Benny Lennert, MD 03/19/12 [phone]

## 2012-03-19 NOTE — ED Notes (Signed)
Pt c/o headache x2-3 days. Pt has hx of migraines but states this does not feel like her usual migraine. Pt states pain radiates from head down entire spine. Pain became much worse over last 12 hours. Pt has not been able to sleep and has decreased appetite due to headache. Pt denies NV, cough, or fever.

## 2012-03-19 NOTE — ED Notes (Signed)
Headache for 3 days no nv.  She usually has headaches but milder

## 2012-03-19 NOTE — Discharge Instructions (Signed)
Please continue to take ibuprofen and tylenol for additional pain relief if needed  Headache, General, Unknown Cause The specific cause of your headache may not have been found today. There are many causes and types of headache. A few common ones are:  Tension headache.   Migraine.   Infections (examples: dental and sinus infections).   Bone and/or joint problems in the neck or jaw.   Depression.   Eye problems.  These headaches are not life threatening.  Headaches can sometimes be diagnosed by a patient history and a physical exam. Sometimes, lab and imaging studies (such as x-ray and/or CT scan) are used to rule out more serious problems. In some cases, a spinal tap (lumbar puncture) may be requested. There are many times when your exam and tests may be normal on the first visit even when there is a serious problem causing your headaches. Because of that, it is very important to follow up with your doctor or local clinic for further evaluation. FINDING OUT THE RESULTS OF TESTS  If a radiology test was performed, a radiologist will review your results.   You will be contacted by the emergency department or your physician if any test results require a change in your treatment plan.   Not all test results may be available during your visit. If your test results are not back during the visit, make an appointment with your caregiver to find out the results. Do not assume everything is normal if you have not heard from your caregiver or the medical facility. It is important for you to follow up on all of your test results.  HOME CARE INSTRUCTIONS   Keep follow-up appointments with your caregiver, or any specialist referral.   Only take over-the-counter or prescription medicines for pain, discomfort, or fever as directed by your caregiver.   Biofeedback, massage, or other relaxation techniques may be helpful.   Ice packs or heat applied to the head and neck can be used. Do this three to  four times per day, or as needed.   Call your doctor if you have any questions or concerns.   If you smoke, you should quit.  SEEK MEDICAL CARE IF:   You develop problems with medications prescribed.   You do not respond to or obtain relief from medications.   You have a change from the usual headache.   You develop nausea or vomiting.  SEEK IMMEDIATE MEDICAL CARE IF:   If your headache becomes severe.   You have an unexplained oral temperature above 102 F (38.9 C), or as your caregiver suggests.   You have a stiff neck.   You have loss of vision.   You have muscular weakness.   You have loss of muscular control.   You develop severe symptoms different from your first symptoms.   You start losing your balance or have trouble walking.   You feel faint or pass out.  MAKE SURE YOU:   Understand these instructions.   Will watch your condition.   Will get help right away if you are not doing well or get worse.  Document Released: 09/30/2005 Document Revised: 09/19/2011 Document Reviewed: 05/19/2008 [facility] Patient Information 2012 [place], [place].

## 2012-03-19 NOTE — ED Notes (Signed)
Patient was offered injection of toradol, for her HA ,which she refused. Peripheral IV absent on arrival to [facility]

## 2012-03-19 NOTE — ED Provider Notes (Signed)
History     CSN: [id number]  Arrival date & time 03/19/12  0454   None    6:10AM HPI A frontal headache that began approximately 2 days ago. Reports pain as throbbing and radiates down her back. Also complaining of photophobia. Denies nausea, vomiting, fever, numbness, tingling, weakness, aphasia, ataxia, upper respiratory tract infection, rash, sore throat. Reports headache is different from her chronic migraines and that any sort of movement sends sharp shooting pains down her back.   Patient is a 21 y.o. female presenting with headaches. The history is provided by the patient.  Headache  This is a new problem. The current episode started 2 days ago. The problem occurs constantly. The problem has been gradually worsening. The pain is located in the frontal region. The quality of the pain is described as throbbing and sharp. The pain is severe. Radiates to: back. Pertinent negatives include no fever, no palpitations, no syncope, no shortness of breath, no nausea and no vomiting.    Past Medical History  Diagnosis Date  . Migraines   . Kidney infection     Past Surgical History  Procedure Date  . Knee surgery     No family history on file.  History  Substance Use Topics  . Smoking status: Never Smoker   . Smokeless tobacco: Not on file  . Alcohol Use: Yes     Occasional     OB History    Grav Para Term Preterm Abortions TAB SAB Ect Mult Living                  Review of Systems  Constitutional: Negative for fever and chills.  HENT: Negative for congestion, sore throat, rhinorrhea, trouble swallowing, neck pain, neck stiffness, postnasal drip and sinus pressure.   Respiratory: Negative for cough and shortness of breath.   Cardiovascular: Negative for palpitations and syncope.  Gastrointestinal: Negative for nausea and vomiting.  Musculoskeletal: Positive for back pain.  Neurological: Positive for headaches. Negative for dizziness, seizures, speech difficulty, weakness,  light-headedness and numbness.  All other systems reviewed and are negative.    Allergies  Darvocet  Home Medications  No current outpatient prescriptions on file.  BP 103/67  Pulse 69  Temp(Src) 97.7 F (36.5 C) (Oral)  Resp 20  SpO2 98%  Physical Exam  Vitals reviewed. Constitutional: She is oriented to person, place, and time. Vital signs are normal. She appears well-developed and well-nourished. No distress.  HENT:  Head: Normocephalic and atraumatic.  Right Ear: Tympanic membrane, external ear and ear canal normal.  Left Ear: Tympanic membrane, external ear and ear canal normal.  Nose: Nose normal. Right sinus exhibits no maxillary sinus tenderness and no frontal sinus tenderness. Left sinus exhibits no maxillary sinus tenderness and no frontal sinus tenderness.  Mouth/Throat: Uvula is midline, oropharynx is clear and moist and mucous membranes are normal. No oropharyngeal exudate.  Eyes: Conjunctivae and EOM are normal. Pupils are equal, round, and reactive to light.  Neck: Neck supple. No spinous process tenderness and no muscular tenderness present. No rigidity. No erythema and normal range of motion present.  Pulmonary/Chest: Effort normal.  Neurological: She is alert and oriented to person, place, and time. She has normal strength. No cranial nerve deficit (tested CN III-XII) or sensory deficit. Abnormal muscle tone: normal hand grips. GCS eye subscore is 4. GCS verbal subscore is 5. GCS motor subscore is 6.  Skin: Skin is warm and dry. No rash noted. No erythema. No pallor.  Psychiatric:  She has a normal mood and affect. Her behavior is normal.    ED Course  Procedures   MDM  6:43 AM  Reports medication has made her sleepy but states she still feels the pain. Will try Mag 2g over 5 minute  8:11 AM Reports feeling better and states she is ready for d/c.  Will cancel magnesium which has not been given to her yet. Patient has no meningeal signs, or signs of  infection. Physical exam is normal. Likely a variant of chronic headaches.        Thomasene Lot, PA-C 03/19/12 [phone]

## 2012-03-19 NOTE — ED Provider Notes (Addendum)
History     CSN: [id number]  Arrival date & time 03/19/12  1457   First MD Initiated Contact with Patient 03/19/12 1647      Chief Complaint  Patient presents with  . Headache    (Consider location/radiation/quality/duration/timing/severity/associated sxs/prior treatment) HPI Comments: Patient presents to urgent care with worsening headache. She describes that she was discharged from the emergency department early this morning went to bed and woke up at 8 AM with severe pain and excruciating discomfort crying and not responsive to Motrin. She describes her vision "goes out" and she feels very dizzy. She denies any vomiting, or nausea. "I have had headaches but nothing like this, this is the worst headache I have had in my life"  Patient is a 21 y.o. female presenting with headaches. The history is provided by the patient.  Headache The primary symptoms include headaches, dizziness and visual change. Primary symptoms do not include syncope, loss of consciousness, altered mental status, seizures, paresthesias, focal weakness, loss of sensation, speech change, memory loss, fever, nausea or vomiting. The symptoms began 3 to 5 days ago. The symptoms are worsening.  The headache is associated with visual change and weakness. The headache is not associated with photophobia, eye pain, neck stiffness or paresthesias.  Dizziness also occurs with weakness. Dizziness does not occur with nausea, vomiting or diaphoresis.  The visual change does not include photophobia.  Additional symptoms include weakness. Additional symptoms do not include neck stiffness, pain, photophobia or vertigo. Medical issues do not include seizures.    Past Medical History  Diagnosis Date  . Migraines   . Kidney infection     Past Surgical History  Procedure Date  . Knee surgery     History reviewed. No pertinent family history.  History  Substance Use Topics  . Smoking status: Never Smoker   . Smokeless tobacco:  Not on file  . Alcohol Use: Yes     Occasional     OB History    Grav Para Term Preterm Abortions TAB SAB Ect Mult Living                  Review of Systems  Constitutional: Positive for activity change and appetite change. Negative for fever, chills, diaphoresis and fatigue.  HENT: Negative for neck stiffness.   Eyes: Positive for visual disturbance. Negative for photophobia, pain and discharge.  Cardiovascular: Negative for syncope.  Gastrointestinal: Negative for nausea and vomiting.  Skin: Negative for rash.  Neurological: Positive for dizziness, weakness, light-headedness and headaches. Negative for vertigo, tremors, speech change, focal weakness, seizures, loss of consciousness, syncope, numbness and paresthesias.  Psychiatric/Behavioral: Negative for memory loss and altered mental status.    Allergies  Darvocet  Home Medications  No current outpatient prescriptions on file.  BP 109/60  Pulse 78  Temp(Src) 98 F (36.7 C) (Oral)  Resp 20  SpO2 100%  Physical Exam  Nursing note and vitals reviewed. Constitutional: She is oriented to person, place, and time. She appears well-developed and well-nourished.  HENT:  Head: Normocephalic.  Eyes: Conjunctivae and EOM are normal. Pupils are equal, round, and reactive to light. No scleral icterus.  Neck: Neck supple. No JVD present.  Abdominal: Soft.  Musculoskeletal: She exhibits no edema.  Lymphadenopathy:    She has no cervical adenopathy.  Neurological: She is alert and oriented to person, place, and time. She displays normal reflexes. No cranial nerve deficit or sensory deficit. She exhibits normal muscle tone. Coordination normal.  Skin: No erythema.  ED Course  Procedures (including critical care time)  Labs Reviewed - No data to display No results found.   No diagnosis found.    MDM  Recurrent and worsening headache. Post discharged from the emergency department.< 12 hours. Patient refused a Toradol  shot at urgent care. Patient denies having ever consulted with a neurologist. She describes her pain is much worse. Continues afebrile.       Jimmie Molly, MD 03/19/12 1706  Jimmie Molly, MD 03/19/12 1745

## 2012-03-19 NOTE — Discharge Instructions (Signed)
Follow up if not improving

## 2012-03-19 NOTE — ED Notes (Signed)
C/o HA for past couple of days, not like her usual HA; was seen at [facility] for HA yesterday, and got medications in injection form, states she was pain free for only a short while, then HA returned. NAD at present, description of HA today same as yesterday's

## 2012-03-24 NOTE — ED Provider Notes (Signed)
Medical screening examination/treatment/procedure(s) were performed by non-physician practitioner and as supervising physician I was immediately available for consultation/collaboration.   Sunnie Nielsen, MD 03/24/12 [phone]

## 2012-07-09 ENCOUNTER — Emergency Department (HOSPITAL_COMMUNITY)
Admission: EM | Admit: 2012-07-09 | Discharge: 2012-07-09 | Disposition: A | Discharge status: Home / Self Care | Payer: Self-pay | Attending: Emergency Medicine | Admitting: Emergency Medicine

## 2012-07-09 ENCOUNTER — Emergency Department (HOSPITAL_COMMUNITY): Payer: Self-pay

## 2012-07-09 DIAGNOSIS — J40 Bronchitis, not specified as acute or chronic: Secondary | ICD-10-CM | POA: Insufficient documentation

## 2012-07-09 MED ORDER — AZITHROMYCIN 250 MG PO TABS
500.0000 mg | ORAL_TABLET | Freq: Once | ORAL | Status: AC
  Administered 2012-07-09: 500 mg via ORAL
  Filled 2012-07-09: qty 2

## 2012-07-09 MED ORDER — ALBUTEROL SULFATE HFA 108 (90 BASE) MCG/ACT IN AERS
2.0000 | INHALATION_SPRAY | RESPIRATORY_TRACT | Status: DC | PRN
  Administered 2012-07-09: 2 via RESPIRATORY_TRACT
  Filled 2012-07-09: qty 6.7

## 2012-07-09 MED ORDER — AZITHROMYCIN 250 MG PO TABS: 250.0000 mg | ORAL_TABLET | Freq: Once | ORAL | Status: DC

## 2012-07-09 NOTE — ED Notes (Signed)
Pt with sore throat for 3 weeks and cough for 2 weeks. Pt states she has been coughing to the point where she throws up blood. Pt c/o waking up with cold sweats. Pt has been taking OTC Nyquil.

## 2012-07-09 NOTE — ED Provider Notes (Signed)
History     CSN: [id number]  Arrival date & time 07/09/12  2152   First MD Initiated Contact with Patient 07/09/12 2159      Chief Complaint  Patient presents with  . Sore Throat  . Cough    (Consider location/radiation/quality/duration/timing/severity/associated sxs/prior treatment) HPI Comments: Patient reports, 2-3, weeks of sore throat, coughing episodes to the point, where she now is vomiting, and becoming incontinent of urine during episodes.  She, states she's had diaphoresis, unsure, of she's had a fever, but chills  Patient is a 21 y.o. female presenting with pharyngitis and cough. The history is provided by the patient.  Sore Throat This is a new problem. The current episode started 1 to 4 weeks ago. The problem occurs intermittently. The problem has been gradually worsening. Associated symptoms include chills, coughing, a fever, a sore throat and vomiting. Pertinent negatives include no congestion or swollen glands. Nothing aggravates the symptoms. She has tried NSAIDs for the symptoms. The treatment provided no relief.  Cough Associated symptoms include chills and sore throat. Pertinent negatives include no shortness of breath and no wheezing.    Past Medical History  Diagnosis Date  . Migraines   . Kidney infection     Past Surgical History  Procedure Date  . Knee surgery     No family history on file.  History  Substance Use Topics  . Smoking status: Never Smoker   . Smokeless tobacco: Not on file  . Alcohol Use: Yes     Occasional     OB History    Grav Para Term Preterm Abortions TAB SAB Ect Mult Living                  Review of Systems  Constitutional: Positive for fever and chills.  HENT: Positive for sore throat. Negative for congestion.   Respiratory: Positive for cough. Negative for shortness of breath and wheezing.   Gastrointestinal: Positive for vomiting.    Allergies  Darvocet  Home Medications   Current Outpatient Rx  Name  Route Sig Dispense Refill  . IBUPROFEN 200 MG PO TABS Oral Take 600 mg by mouth every 6 (six) hours as needed. For pain/fever    . LEVONORGESTREL 20 MCG/24HR IU IUD Intrauterine 1 each by Intrauterine route once.    . ADULT MULTIVITAMIN W/MINERALS CH Oral Take 1 tablet by mouth daily.    . DAYQUIL/NYQUIL COLD/FLU RELIEF PO Oral Take 30 mLs by mouth every 6 (six) hours as needed. For cold symptoms    . VITAMIN C 500 MG PO TABS Oral Take 500 mg by mouth daily.    . AZITHROMYCIN 250 MG PO TABS Oral Take 1 tablet (250 mg total) by mouth once. 4 tablet 0    BP 119/74  Pulse 100  Temp 98.3 F (36.8 C) (Oral)  Resp 16  SpO2 99%  LMP 06/18/2012  Physical Exam  Constitutional: She is oriented to person, place, and time. She appears well-developed and well-nourished.  HENT:  Head: Normocephalic.  Mouth/Throat: Uvula is midline.  Eyes: Pupils are equal, round, and reactive to light.  Neck: Normal range of motion.  Cardiovascular: Normal rate.   Pulmonary/Chest: Effort normal. No respiratory distress. She has no wheezes. She exhibits no tenderness.  Musculoskeletal: Normal range of motion.  Neurological: She is alert and oriented to person, place, and time.  Skin: Skin is warm. No rash noted. No erythema.    ED Course  Procedures (including critical care time)  Labs Reviewed -  No data to display Dg Chest 2 View  07/09/2012  *RADIOLOGY REPORT*  Clinical Data: 21 year old female with cough shortness of breath and chest pain.  CHEST - 2 VIEW  Comparison: 11/13/2011 and earlier.  Findings: Improved lung volumes, now normal. Normal cardiac size and mediastinal contours.  Visualized tracheal air column is within normal limits.  No pneumothorax, pulmonary edema, pleural effusion or confluent pulmonary opacity. No acute osseous abnormality identified.  IMPRESSION: No acute cardiopulmonary abnormality.   Original Report Authenticated By: Harley Hallmark, M.D.      1. Bronchitis       MDM    We'll treat with azithromycin, and albuterol for, bronchitis.  That's been going on for 2-3, weeks        Arman Filter, NP 07/09/12 2316  Arman Filter, NP 07/09/12 2316

## 2012-07-10 NOTE — ED Provider Notes (Signed)
Medical screening examination/treatment/procedure(s) were performed by non-physician practitioner and as supervising physician I was immediately available for consultation/collaboration. Roberta Angell, MD, FACEP   Ardis Lawley L Denielle Bayard, MD 07/10/12 0024 

## 2012-09-08 ENCOUNTER — Emergency Department (HOSPITAL_COMMUNITY): Payer: Self-pay

## 2012-09-08 ENCOUNTER — Encounter (HOSPITAL_COMMUNITY): Payer: Self-pay | Admitting: *Deleted

## 2012-09-08 ENCOUNTER — Emergency Department (HOSPITAL_COMMUNITY)
Admission: EM | Admit: 2012-09-08 | Discharge: 2012-09-08 | Disposition: A | Discharge status: Home / Self Care | Payer: Self-pay | Attending: Emergency Medicine | Admitting: Emergency Medicine

## 2012-09-08 DIAGNOSIS — R599 Enlarged lymph nodes, unspecified: Secondary | ICD-10-CM | POA: Insufficient documentation

## 2012-09-08 DIAGNOSIS — Z87891 Personal history of nicotine dependence: Secondary | ICD-10-CM | POA: Insufficient documentation

## 2012-09-08 DIAGNOSIS — Z8679 Personal history of other diseases of the circulatory system: Secondary | ICD-10-CM | POA: Insufficient documentation

## 2012-09-08 DIAGNOSIS — R079 Chest pain, unspecified: Secondary | ICD-10-CM | POA: Insufficient documentation

## 2012-09-08 DIAGNOSIS — R6883 Chills (without fever): Secondary | ICD-10-CM | POA: Insufficient documentation

## 2012-09-08 DIAGNOSIS — J329 Chronic sinusitis, unspecified: Secondary | ICD-10-CM | POA: Insufficient documentation

## 2012-09-08 DIAGNOSIS — R05 Cough: Secondary | ICD-10-CM | POA: Insufficient documentation

## 2012-09-08 DIAGNOSIS — H938X9 Other specified disorders of ear, unspecified ear: Secondary | ICD-10-CM | POA: Insufficient documentation

## 2012-09-08 DIAGNOSIS — J3489 Other specified disorders of nose and nasal sinuses: Secondary | ICD-10-CM | POA: Insufficient documentation

## 2012-09-08 DIAGNOSIS — R51 Headache: Secondary | ICD-10-CM | POA: Insufficient documentation

## 2012-09-08 DIAGNOSIS — R059 Cough, unspecified: Secondary | ICD-10-CM | POA: Insufficient documentation

## 2012-09-08 DIAGNOSIS — IMO0001 Reserved for inherently not codable concepts without codable children: Secondary | ICD-10-CM | POA: Insufficient documentation

## 2012-09-08 DIAGNOSIS — Z87448 Personal history of other diseases of urinary system: Secondary | ICD-10-CM | POA: Insufficient documentation

## 2012-09-08 MED ORDER — AZITHROMYCIN 250 MG PO TABS
500.0000 mg | ORAL_TABLET | Freq: Once | ORAL | Status: AC
  Administered 2012-09-08: 500 mg via ORAL
  Filled 2012-09-08: qty 2

## 2012-09-08 MED ORDER — HYDROCODONE-ACETAMINOPHEN 7.5-500 MG/15ML PO SOLN: 15.0000 mL | Freq: Three times a day (TID) | ORAL | Status: DC | PRN

## 2012-09-08 MED ORDER — AZITHROMYCIN 250 MG PO TABS: 250.0000 mg | ORAL_TABLET | Freq: Every day | ORAL | Status: DC

## 2012-09-08 NOTE — ED Provider Notes (Signed)
History     CSN: [id number]  Arrival date & time 09/08/12  1942   First MD Initiated Contact with Patient 09/08/12 2124      No chief complaint on file.   (Consider location/radiation/quality/duration/timing/severity/associated sxs/prior treatment) Patient is a 21 y.o. female presenting with URI. The history is provided by the patient.  URI The primary symptoms include headaches, sore throat, swollen glands, cough and myalgias. Primary symptoms do not include fever, nausea or rash. The current episode started more than 1 week ago.  The sore throat is not accompanied by trouble swallowing.  The swelling is not associated with trouble swallowing.  Symptoms associated with the illness include chills, plugged ear sensation, facial pain, sinus pressure and congestion.    Past Medical History  Diagnosis Date  . Migraines   . Kidney infection     Past Surgical History  Procedure Date  . Knee surgery     No family history on file.  History  Substance Use Topics  . Smoking status: Former Games developer  . Smokeless tobacco: Not on file  . Alcohol Use: Yes     Comment: Occasional     OB History    Grav Para Term Preterm Abortions TAB SAB Ect Mult Living                  Review of Systems  Constitutional: Positive for chills. Negative for fever.  HENT: Positive for congestion, sore throat and sinus pressure. Negative for trouble swallowing.   Respiratory: Positive for cough. Negative for shortness of breath.   Cardiovascular:       Complains of pain to left side chest with cough.  Gastrointestinal: Negative for nausea.  Musculoskeletal: Positive for myalgias.  Skin: Negative for rash.  Neurological: Positive for headaches.    Allergies  Darvocet  Home Medications   Current Outpatient Rx  Name  Route  Sig  Dispense  Refill  . ADULT MULTIVITAMIN W/MINERALS CH   Oral   Take 1 tablet by mouth daily.         Marland Kitchen VITAMIN C 500 MG PO TABS   Oral   Take 500 mg by mouth  daily.         Marland Kitchen LEVONORGESTREL 20 MCG/24HR IU IUD   Intrauterine   1 each by Intrauterine route once.           BP 109/66  Pulse 90  Temp 99.1 F (37.3 C)  Resp 20  SpO2 99%  LMP 08/25/2012  Physical Exam  Constitutional: She is oriented to person, place, and time. She appears well-developed and well-nourished. No distress.  HENT:  Head: Normocephalic.  Right Ear: External ear normal.  Left Ear: External ear normal.  Nose: Mucosal edema present. Right sinus exhibits frontal sinus tenderness. Left sinus exhibits frontal sinus tenderness.  Mouth/Throat: Oropharynx is clear and moist.  Neck: Normal range of motion. Neck supple.  Cardiovascular: Normal rate and normal heart sounds.   No murmur heard. Pulmonary/Chest: Effort normal and breath sounds normal. She has no wheezes. She has no rales.  Abdominal: Soft. Bowel sounds are normal. She exhibits no distension. There is no tenderness.  Musculoskeletal: Normal range of motion.  Lymphadenopathy:    She has no cervical adenopathy.  Neurological: She is alert and oriented to person, place, and time.  Skin: Skin is warm and dry. No pallor.    ED Course  Procedures (including critical care time)  Labs Reviewed - No data to display Dg Chest 2 View  09/08/2012  *RADIOLOGY REPORT*  Clinical Data: Cough, chest pain, fever.  CHEST - 2 VIEW  Comparison: None.  Findings: Heart and mediastinal contours are within normal limits. No focal opacities or effusions.  No acute bony abnormality.  IMPRESSION: No active cardiopulmonary disease.   Original Report Authenticated By: Charlett Nose, M.D.      No diagnosis found. 1. Sinusitis    MDM  URI/sinus symptoms greater than 2 weeks. Will cover with Z-pack and supportive measures.        Rodena Medin, PA-C 09/08/12 2231

## 2012-09-08 NOTE — ED Notes (Signed)
Pt c/o cough x 2 wks; increased congestion; pain left side rib cage; "unbearable"; chills

## 2012-09-08 NOTE — ED Provider Notes (Signed)
Medical screening examination/treatment/procedure(s) were performed by non-physician practitioner and as supervising physician I was immediately available for consultation/collaboration.  Azael Ragain, MD 09/08/12 2345 

## 2012-09-20 ENCOUNTER — Encounter (HOSPITAL_COMMUNITY): Payer: Self-pay | Admitting: *Deleted

## 2012-09-20 ENCOUNTER — Emergency Department (HOSPITAL_COMMUNITY)
Admission: EM | Admit: 2012-09-20 | Discharge: 2012-09-20 | Disposition: A | Discharge status: Home / Self Care | Payer: Self-pay | Attending: Emergency Medicine | Admitting: Emergency Medicine

## 2012-09-20 DIAGNOSIS — Z3202 Encounter for pregnancy test, result negative: Secondary | ICD-10-CM | POA: Insufficient documentation

## 2012-09-20 DIAGNOSIS — R109 Unspecified abdominal pain: Secondary | ICD-10-CM | POA: Insufficient documentation

## 2012-09-20 DIAGNOSIS — Z87891 Personal history of nicotine dependence: Secondary | ICD-10-CM | POA: Insufficient documentation

## 2012-09-20 LAB — URINALYSIS, MICROSCOPIC ONLY
Bilirubin Urine: NEGATIVE
Glucose, UA: NEGATIVE mg/dL
Protein, ur: NEGATIVE mg/dL
Specific Gravity, Urine: 1.022 (ref 1.005–1.030)

## 2012-09-20 LAB — POCT PREGNANCY, URINE: Preg Test, Ur: NEGATIVE

## 2012-09-20 LAB — CBC WITH DIFFERENTIAL/PLATELET
Basophils Relative: 0 % (ref 0–1)
Hemoglobin: 14.1 g/dL (ref 12.0–15.0)
MCHC: 34.3 g/dL (ref 30.0–36.0)
Monocytes Relative: 6 % (ref 3–12)
Neutro Abs: 5.1 10*3/uL (ref 1.7–7.7)
Neutrophils Relative %: 71 % (ref 43–77)
Platelets: 231 10*3/uL (ref 150–400)
RBC: 4.54 MIL/uL (ref 3.87–5.11)

## 2012-09-20 LAB — COMPREHENSIVE METABOLIC PANEL
ALT: 17 U/L (ref 0–35)
AST: 17 U/L (ref 0–37)
Albumin: 3.8 g/dL (ref 3.5–5.2)
CO2: 27 mEq/L (ref 19–32)
Chloride: 104 mEq/L (ref 96–112)
Creatinine, Ser: 0.85 mg/dL (ref 0.50–1.10)
GFR calc non Af Amer: >90 mL/min (ref >90)
Sodium: 138 mEq/L (ref 135–145)
Total Bilirubin: 0.7 mg/dL (ref 0.3–1.2)

## 2012-09-20 NOTE — ED Notes (Signed)
Pt called multiple times no response  

## 2012-09-20 NOTE — ED Notes (Signed)
Patient called x2 no answer. 

## 2012-09-20 NOTE — ED Notes (Signed)
Pt reports lower abd pain x at least one month. Denies vaginal or urinary symptoms but pain increases when having intercourse. Denies n/v/d. No acute distress noted at triage.

## 2012-09-20 NOTE — ED Notes (Signed)
Pt called with no answer

## 2015-08-01 ENCOUNTER — Encounter (HOSPITAL_COMMUNITY): Payer: Self-pay | Admitting: *Deleted

## 2015-08-17 ENCOUNTER — Encounter (HOSPITAL_COMMUNITY): Payer: Self-pay

## 2015-08-17 ENCOUNTER — Ambulatory Visit (HOSPITAL_COMMUNITY)
Admission: RE | Admit: 2015-08-17 | Discharge: 2015-08-17 | Disposition: A | Discharge status: Home / Self Care | Payer: Self-pay | Source: Ambulatory Visit | Attending: Obstetrics and Gynecology | Admitting: Obstetrics and Gynecology

## 2015-08-17 VITALS — BP 124/80 | Temp 97.8°F | Ht 64.0 in | Wt 207.0 lb

## 2015-08-17 DIAGNOSIS — R87610 Atypical squamous cells of undetermined significance on cytologic smear of cervix (ASC-US): Secondary | ICD-10-CM

## 2015-08-17 DIAGNOSIS — R87810 Cervical high risk human papillomavirus (HPV) DNA test positive: Secondary | ICD-10-CM

## 2015-08-17 DIAGNOSIS — Z1239 Encounter for other screening for malignant neoplasm of breast: Secondary | ICD-10-CM

## 2015-08-17 NOTE — Patient Instructions (Signed)
Educational materials on self breast awareness given. Explained to Dana Snyder the colposcopy that is recommended for follow up for abnormal Pap smear 07/11/2015. [facility]. Appointment scheduled for Monday, August 28, 2015 at 1400. Patient aware of appointment and will be there. Screening mammogram recommended at age 24 unless clinically indicated prior. Dana Snyder verbalized understanding.  Imberly Troxler, Kathaleen Maserhristine Poll, RN 9:01 AM

## 2015-08-17 NOTE — Progress Notes (Signed)
No complaints today.  Pap Smear:  Pap smear not completed today. [facility] with Positive HPV. [facility] for colposcopy to follow up for abnormal Pap smear. Appointment scheduled for Monday, August 28, 2015 at 1400. Per patient has no history of abnormal Pap smears prior to the most recent Pap smear. Pap smear result is scanned in EPIC under media.  Physical exam: Breasts Breasts symmetrical. No skin abnormalities bilateral breasts. No nipple retraction bilateral breasts. No nipple discharge bilateral breasts. No lymphadenopathy. No lumps palpated bilateral breasts. No complaints of pain or tenderness on exam. Screening mammogram recommended at age 24 unless clinically indicated prior.      Pelvic/Bimanual No Pap smear completed today since last Pap smear was 07/11/2015. Pap smear not indicated per BCCCP guidelines.

## 2015-08-28 ENCOUNTER — Ambulatory Visit (INDEPENDENT_AMBULATORY_CARE_PROVIDER_SITE_OTHER): Payer: Self-pay | Admitting: Obstetrics & Gynecology

## 2015-08-28 ENCOUNTER — Other Ambulatory Visit (HOSPITAL_COMMUNITY)
Admission: RE | Admit: 2015-08-28 | Discharge: 2015-08-28 | Disposition: A | Discharge status: Home / Self Care | Payer: Self-pay | Source: Ambulatory Visit | Attending: Obstetrics & Gynecology | Admitting: Obstetrics & Gynecology

## 2015-08-28 ENCOUNTER — Encounter: Payer: Self-pay | Admitting: Obstetrics & Gynecology

## 2015-08-28 VITALS — BP 112/73 | HR 79 | Temp 98.4°F | Ht 64.0 in | Wt 203.7 lb

## 2015-08-28 DIAGNOSIS — R87610 Atypical squamous cells of undetermined significance on cytologic smear of cervix (ASC-US): Secondary | ICD-10-CM

## 2015-08-28 DIAGNOSIS — R87810 Cervical high risk human papillomavirus (HPV) DNA test positive: Principal | ICD-10-CM

## 2015-08-28 LAB — POCT PREGNANCY, URINE: PREG TEST UR: NEGATIVE

## 2015-08-28 NOTE — Patient Instructions (Signed)

## 2015-08-28 NOTE — Progress Notes (Signed)
Patient ID: Dana Snyder, female   DOB: 1991/06/25, 24 y.o.   MRN: [id number]  Chief Complaint  Patient presents with  . Colposcopy  ASCUS pap at HD, [facility] referral  HPI Dana Snyder is a 24 y.o. female.  Dana Snyder Patient's last menstrual period was 08/10/2015 (exact date).   HPI  Indications: Pap smear on September 2016 showed: ASCUS with POSITIVE high risk HPV. Previous colposcopy:  Prior cervical treatment: no treatment.  Past Medical History  Diagnosis Date  . Migraines   . Kidney infection   . Vaginal Pap smear, abnormal     Past Surgical History  Procedure Laterality Date  . Knee surgery  2009  . Cesarean section  2011  . Wrist surgery  2006    cyst removed    Family History  Problem Relation Age of Onset  . Hypertension Mother   . Diabetes Father   . Hypertension Father   . Stroke Paternal Grandmother     Social History Social History  Substance Use Topics  . Smoking status: Former Games developermoker  . Smokeless tobacco: Never Used  . Alcohol Use: Yes     Comment: Occasional     Allergies  Allergen Reactions  . Darvocet [Propoxyphene N-Acetaminophen] Hives    Current Outpatient Prescriptions  Medication Sig Dispense Refill  . acetaminophen (TYLENOL) 500 MG tablet Take 500 mg by mouth every 6 (six) hours as needed.    Marland Kitchen. ibuprofen (ADVIL,MOTRIN) 800 MG tablet Take 800 mg by mouth every 8 (eight) hours as needed.     No current facility-administered medications for this visit.    Review of Systems Review of Systems  Blood pressure 112/73, pulse 79, temperature 98.4 F (36.9 C), height 5\' 4"  (1.626 m), weight 203 lb 11.2 oz (92.398 kg), last menstrual period 08/10/2015.  Physical Exam Physical Exam  Data Reviewed Pap and HPV  Assessment   LSIL Procedure Details  The risks and benefits of the procedure and Written informed consent obtained.  Speculum placed in vagina and excellent visualization of cervix achieved, cervix swabbed x 3 with acetic  acid solution. SCJ seen, anterior AWE to <1 cm into the os TZ seen.  Specimens: ECC and 10 and 12  Monsel's soln applied  Complications: none.     Plan    Specimens labelled and sent to Pathology. Call to discuss pathology results      ARNOLD,JAMES 08/28/2015, 2:55 PM

## 2015-09-25 ENCOUNTER — Telehealth: Payer: Self-pay | Admitting: *Deleted

## 2015-09-25 ENCOUNTER — Encounter: Payer: Self-pay | Admitting: *Deleted

## 2015-09-25 NOTE — Telephone Encounter (Signed)
Per Dr. Debroah LoopArnold patient should repeat papsmear in 1 year. Message on patients phone stated that she doesn't accept incoming calls. Letter to patient with results.

## 2015-10-15 HISTORY — DX: Maternal care for unspecified type scar from previous cesarean delivery: O34.219

## 2015-10-15 NOTE — L&D Delivery Note (Signed)
Delivery Note At 6:45 PM a viable and healthy female was delivered via Vaginal, Spontaneous Delivery (Presentation: OA, LOT).  APGAR: 8,9 ; weight P .   Placenta status: delivered, intact.  Cord: 3V with the following complications: none  Anesthesia:  epidural Episiotomy: None Lacerations:  Labial Suture Repair: 3.0 vicryl rapide Est. Blood Loss (mL): 250cc  Mom to postpartum.  Baby to Couplet care / Skin to Skin.  Bovard-Stuckert, Romello Hoehn 05/14/2016, 7:00 PM  Bo/O-/RI/Tdap in PNC/Mirena

## 2015-12-12 LAB — OB RESULTS CONSOLE RUBELLA ANTIBODY, IGM: Rubella: IMMUNE

## 2015-12-12 LAB — OB RESULTS CONSOLE RPR: RPR: NONREACTIVE

## 2015-12-12 LAB — OB RESULTS CONSOLE GC/CHLAMYDIA
Chlamydia: NEGATIVE
Gonorrhea: NEGATIVE

## 2015-12-12 LAB — OB RESULTS CONSOLE HEPATITIS B SURFACE ANTIGEN: Hepatitis B Surface Ag: NEGATIVE

## 2015-12-12 LAB — OB RESULTS CONSOLE ANTIBODY SCREEN: Antibody Screen: NEGATIVE

## 2015-12-12 LAB — OB RESULTS CONSOLE HIV ANTIBODY (ROUTINE TESTING): HIV: NONREACTIVE

## 2015-12-12 LAB — OB RESULTS CONSOLE ABO/RH: RH TYPE: NEGATIVE

## 2016-04-15 LAB — OB RESULTS CONSOLE GBS: STREP GROUP B AG: NEGATIVE

## 2016-05-09 ENCOUNTER — Encounter (HOSPITAL_COMMUNITY): Payer: Self-pay | Admitting: *Deleted

## 2016-05-09 ENCOUNTER — Telehealth (HOSPITAL_COMMUNITY): Payer: Self-pay | Admitting: *Deleted

## 2016-05-09 NOTE — Telephone Encounter (Signed)
Preadmission screen  

## 2016-05-10 ENCOUNTER — Telehealth (HOSPITAL_COMMUNITY): Payer: Self-pay | Admitting: *Deleted

## 2016-05-10 NOTE — Telephone Encounter (Signed)
Preadmission screen  

## 2016-05-13 ENCOUNTER — Encounter (HOSPITAL_COMMUNITY): Payer: Self-pay

## 2016-05-13 DIAGNOSIS — Z3483 Encounter for supervision of other normal pregnancy, third trimester: Secondary | ICD-10-CM

## 2016-05-13 DIAGNOSIS — IMO0002 Reserved for concepts with insufficient information to code with codable children: Secondary | ICD-10-CM

## 2016-05-13 HISTORY — DX: Reserved for concepts with insufficient information to code with codable children: IMO0002

## 2016-05-13 NOTE — H&P (Signed)
Dana Snyder is a 25 y.o. female G3P2002 at 39+. with favorable cervix, desires IOL and TOLAC.  Has had vaginal delivery before.  G2 LTCS due to breech presentation. D/W pt r/b/a of TOLAC, will ROM and wait for pitocin.  GBBS negative.  Relatively uncomplicated PNC except later start at 16 weeks.    OB History    Gravida Para Term Preterm AB Living   4 2 2   1 2    SAB TAB Ectopic Multiple Live Births                + abn pap 2016 - ASCUS HR HPV + - colpo LGSIL, repeat 9/17 + chl  G1 SVD 6#7 female 2009 G2 20119#15 female LTCS G3 present  Past Medical History:  Diagnosis Date  . Encounter for trial of labor 05/13/2016  . History of shingles   . Kidney infection   . Migraines   . Vaginal Pap smear, abnormal    Past Surgical History:  Procedure Laterality Date  . CESAREAN SECTION  2011  . KNEE SURGERY  2009  . WRIST SURGERY  2006   cyst removed   Family History: family history includes Diabetes in her father; Hypertension in her father and mother; Stroke in her paternal grandmother. Social History:  reports that she has quit smoking. She has never used smokeless tobacco. She reports that she drinks alcohol. She reports that she does not use drugs.   Bartender, married meds PNV All Darvocet     Maternal Diabetes: No Genetic Screening: Declined Maternal Ultrasounds/Referrals: Normal Fetal Ultrasounds or other Referrals:  None Maternal Substance Abuse:  Yes:  Type: Smoker Significant Maternal Medications:  None Significant Maternal Lab Results:  Lab values include: Group B Strep negative, Rh negative Other Comments:  quit smoking with pregnancy.  Rhogam, Tdap 5/17  Review of Systems  Constitutional: Negative.   HENT: Negative.   Eyes: Negative.   Respiratory: Negative.   Cardiovascular: Negative.   Gastrointestinal: Negative.   Genitourinary: Negative.   Musculoskeletal: Positive for back pain.  Skin: Negative.   Neurological: Negative.   Psychiatric/Behavioral:  Negative.    Maternal Medical History:  Contractions: Frequency: irregular.   Perceived severity is moderate.    Fetal activity: Perceived fetal activity is normal.    Prenatal complications: no prenatal complications Prenatal Complications - Diabetes: none.      Last menstrual period 08/10/2015. Maternal Exam:  Uterine Assessment: Contraction frequency is irregular.   Abdomen: Fundal height is appropriate for gestation.   Estimated fetal weight is 8#.   Fetal presentation: vertex  Introitus: Normal vulva. Normal vagina.  Cervix: Cervix evaluated by digital exam.     Physical Exam  Constitutional: She is oriented to person, place, and time. She appears well-developed and well-nourished.  HENT:  Head: Normocephalic and atraumatic.  Cardiovascular: Normal rate and regular rhythm.   Respiratory: Effort normal and breath sounds normal. No respiratory distress. She has no wheezes.  GI: Soft. Bowel sounds are normal. She exhibits no distension. There is no tenderness.  Musculoskeletal: Normal range of motion.  Neurological: She is alert and oriented to person, place, and time.  Skin: Skin is warm and dry.  Psychiatric: She has a normal mood and affect. Her behavior is normal.    Prenatal labs: ABO, Rh: O/Negative/-- (02/28 0000) Antibody: Negative (02/28 0000) Rubella: Immune (02/28 0000) RPR: Nonreactive (02/28 0000)  HBsAg: Negative (02/28 0000)  HIV: Non-reactive (02/28 0000)  GBS: Negative (07/03 0000)   Hgb 11.5/Plt  250K/Ur Cx neg/GC neg/Chl neg/glucola 102/  Tdap/Rhogam 5/17 Nl anat, post plac, gender reveal  Assessment/Plan: 25yo G3P2002 at 39+ for IOL, TOLAC admit and AROM  Poss augment with pitocin GBBS neg  Bovard-Stuckert, Doyle Kunath 05/13/2016, 9:23 PM

## 2016-05-14 ENCOUNTER — Inpatient Hospital Stay (HOSPITAL_COMMUNITY): Payer: Medicaid Other | Admitting: Anesthesiology

## 2016-05-14 ENCOUNTER — Inpatient Hospital Stay (HOSPITAL_COMMUNITY)
Admission: RE | Admit: 2016-05-14 | Discharge: 2016-05-16 | DRG: 775 | Disposition: A | Discharge status: Home / Self Care | Payer: Medicaid Other | Source: Ambulatory Visit | Attending: Obstetrics and Gynecology | Admitting: Obstetrics and Gynecology

## 2016-05-14 ENCOUNTER — Encounter (HOSPITAL_COMMUNITY): Payer: Self-pay

## 2016-05-14 DIAGNOSIS — Z3A39 39 weeks gestation of pregnancy: Secondary | ICD-10-CM

## 2016-05-14 DIAGNOSIS — Z8619 Personal history of other infectious and parasitic diseases: Secondary | ICD-10-CM | POA: Diagnosis not present

## 2016-05-14 DIAGNOSIS — O34219 Maternal care for unspecified type scar from previous cesarean delivery: Secondary | ICD-10-CM

## 2016-05-14 DIAGNOSIS — Z833 Family history of diabetes mellitus: Secondary | ICD-10-CM | POA: Diagnosis not present

## 2016-05-14 DIAGNOSIS — O26893 Other specified pregnancy related conditions, third trimester: Secondary | ICD-10-CM | POA: Diagnosis present

## 2016-05-14 DIAGNOSIS — Z3483 Encounter for supervision of other normal pregnancy, third trimester: Secondary | ICD-10-CM

## 2016-05-14 DIAGNOSIS — Z823 Family history of stroke: Secondary | ICD-10-CM

## 2016-05-14 DIAGNOSIS — Z87891 Personal history of nicotine dependence: Secondary | ICD-10-CM | POA: Diagnosis not present

## 2016-05-14 DIAGNOSIS — Z6791 Unspecified blood type, Rh negative: Secondary | ICD-10-CM

## 2016-05-14 DIAGNOSIS — IMO0002 Reserved for concepts with insufficient information to code with codable children: Secondary | ICD-10-CM

## 2016-05-14 DIAGNOSIS — Z8249 Family history of ischemic heart disease and other diseases of the circulatory system: Secondary | ICD-10-CM | POA: Diagnosis not present

## 2016-05-14 DIAGNOSIS — O34211 Maternal care for low transverse scar from previous cesarean delivery: Secondary | ICD-10-CM | POA: Diagnosis present

## 2016-05-14 HISTORY — DX: Reserved for concepts with insufficient information to code with codable children: IMO0002

## 2016-05-14 LAB — CBC
HCT: 30.6 % — ABNORMAL LOW (ref 36.0–46.0)
Hemoglobin: 10.3 g/dL — ABNORMAL LOW (ref 12.0–15.0)
MCH: 30.6 pg (ref 26.0–34.0)
MCHC: 33.7 g/dL (ref 30.0–36.0)
MCV: 90.8 fL (ref 78.0–100.0)
PLATELETS: 222 10*3/uL (ref 150–400)
RBC: 3.37 MIL/uL — AB (ref 3.87–5.11)
RDW: 14 % (ref 11.5–15.5)
WBC: 9.4 10*3/uL (ref 4.0–10.5)

## 2016-05-14 LAB — RPR: RPR: NONREACTIVE

## 2016-05-14 LAB — TYPE AND SCREEN
ABO/RH(D): O NEG
ANTIBODY SCREEN: NEGATIVE

## 2016-05-14 MED ORDER — ZOLPIDEM TARTRATE 5 MG PO TABS: 5.0000 mg | ORAL_TABLET | Freq: Every evening | ORAL | Status: DC | PRN

## 2016-05-14 MED ORDER — SIMETHICONE 80 MG PO CHEW: 80.0000 mg | CHEWABLE_TABLET | ORAL | Status: DC | PRN

## 2016-05-14 MED ORDER — ACETAMINOPHEN 325 MG PO TABS
650.0000 mg | ORAL_TABLET | ORAL | Status: DC | PRN
  Administered 2016-05-14: 650 mg via ORAL
  Filled 2016-05-14: qty 2

## 2016-05-14 MED ORDER — OXYCODONE-ACETAMINOPHEN 5-325 MG PO TABS: 2.0000 | ORAL_TABLET | ORAL | Status: DC | PRN

## 2016-05-14 MED ORDER — ONDANSETRON HCL 4 MG/2ML IJ SOLN: 4.0000 mg | INTRAMUSCULAR | Status: DC | PRN

## 2016-05-14 MED ORDER — OXYTOCIN 40 UNITS IN LACTATED RINGERS INFUSION - SIMPLE MED
2.5000 [IU]/h | INTRAVENOUS | Status: DC
  Administered 2016-05-14: 2.5 [IU]/h via INTRAVENOUS
  Filled 2016-05-14: qty 1000

## 2016-05-14 MED ORDER — EPHEDRINE 5 MG/ML INJ
10.0000 mg | INTRAVENOUS | Status: DC | PRN
  Filled 2016-05-14: qty 4

## 2016-05-14 MED ORDER — DIPHENHYDRAMINE HCL 50 MG/ML IJ SOLN: 12.5000 mg | INTRAMUSCULAR | Status: DC | PRN

## 2016-05-14 MED ORDER — DIBUCAINE 1 % RE OINT: 1.0000 "application " | TOPICAL_OINTMENT | RECTAL | Status: DC | PRN

## 2016-05-14 MED ORDER — OXYCODONE HCL 5 MG PO TABS: 10.0000 mg | ORAL_TABLET | ORAL | Status: DC | PRN

## 2016-05-14 MED ORDER — LACTATED RINGERS IV SOLN: INTRAVENOUS | Status: DC

## 2016-05-14 MED ORDER — LIDOCAINE HCL (PF) 1 % IJ SOLN
30.0000 mL | INTRAMUSCULAR | Status: DC | PRN
  Filled 2016-05-14: qty 30

## 2016-05-14 MED ORDER — BENZOCAINE-MENTHOL 20-0.5 % EX AERO
1.0000 "application " | INHALATION_SPRAY | CUTANEOUS | Status: DC | PRN
  Administered 2016-05-14: 1 via TOPICAL
  Filled 2016-05-14: qty 56

## 2016-05-14 MED ORDER — LACTATED RINGERS IV SOLN
500.0000 mL | Freq: Once | INTRAVENOUS | Status: AC
  Administered 2016-05-14: 500 mL via INTRAVENOUS

## 2016-05-14 MED ORDER — LACTATED RINGERS IV SOLN: 500.0000 mL | Freq: Once | INTRAVENOUS | Status: DC

## 2016-05-14 MED ORDER — ACETAMINOPHEN 325 MG PO TABS: 650.0000 mg | ORAL_TABLET | ORAL | Status: DC | PRN

## 2016-05-14 MED ORDER — TERBUTALINE SULFATE 1 MG/ML IJ SOLN
0.2500 mg | Freq: Once | INTRAMUSCULAR | Status: DC | PRN
  Filled 2016-05-14: qty 1

## 2016-05-14 MED ORDER — PHENYLEPHRINE 40 MCG/ML (10ML) SYRINGE FOR IV PUSH (FOR BLOOD PRESSURE SUPPORT)
80.0000 ug | PREFILLED_SYRINGE | INTRAVENOUS | Status: DC | PRN
  Filled 2016-05-14: qty 10
  Filled 2016-05-14: qty 5

## 2016-05-14 MED ORDER — ONDANSETRON HCL 4 MG/2ML IJ SOLN: 4.0000 mg | Freq: Four times a day (QID) | INTRAMUSCULAR | Status: DC | PRN

## 2016-05-14 MED ORDER — WITCH HAZEL-GLYCERIN EX PADS: 1.0000 "application " | MEDICATED_PAD | CUTANEOUS | Status: DC | PRN

## 2016-05-14 MED ORDER — PHENYLEPHRINE 40 MCG/ML (10ML) SYRINGE FOR IV PUSH (FOR BLOOD PRESSURE SUPPORT)
80.0000 ug | PREFILLED_SYRINGE | INTRAVENOUS | Status: DC | PRN
  Filled 2016-05-14: qty 5

## 2016-05-14 MED ORDER — BUTORPHANOL TARTRATE 1 MG/ML IJ SOLN: 1.0000 mg | INTRAMUSCULAR | Status: DC | PRN

## 2016-05-14 MED ORDER — FENTANYL 2.5 MCG/ML BUPIVACAINE 1/10 % EPIDURAL INFUSION (WH - ANES)
14.0000 mL/h | INTRAMUSCULAR | Status: DC | PRN
  Administered 2016-05-14: 14 mL/h via EPIDURAL
  Filled 2016-05-14: qty 125

## 2016-05-14 MED ORDER — DIPHENHYDRAMINE HCL 25 MG PO CAPS
25.0000 mg | ORAL_CAPSULE | Freq: Four times a day (QID) | ORAL | Status: DC | PRN
Start: 2016-05-14 — End: 2016-05-16

## 2016-05-14 MED ORDER — LACTATED RINGERS IV SOLN: 500.0000 mL | INTRAVENOUS | Status: DC | PRN

## 2016-05-14 MED ORDER — OXYTOCIN 40 UNITS IN LACTATED RINGERS INFUSION - SIMPLE MED
1.0000 m[IU]/min | INTRAVENOUS | Status: DC
  Administered 2016-05-14: 2 m[IU]/min via INTRAVENOUS

## 2016-05-14 MED ORDER — LIDOCAINE HCL (PF) 1 % IJ SOLN
INTRAMUSCULAR | Status: DC | PRN
  Administered 2016-05-14 (×2): 6 mL via EPIDURAL

## 2016-05-14 MED ORDER — PRENATAL MULTIVITAMIN CH
1.0000 | ORAL_TABLET | Freq: Every day | ORAL | Status: DC
  Administered 2016-05-15 – 2016-05-16 (×2): 1 via ORAL
  Filled 2016-05-14 (×2): qty 1

## 2016-05-14 MED ORDER — OXYCODONE-ACETAMINOPHEN 5-325 MG PO TABS: 1.0000 | ORAL_TABLET | ORAL | Status: DC | PRN

## 2016-05-14 MED ORDER — SOD CITRATE-CITRIC ACID 500-334 MG/5ML PO SOLN: 30.0000 mL | ORAL | Status: DC | PRN

## 2016-05-14 MED ORDER — COCONUT OIL OIL: 1.0000 "application " | TOPICAL_OIL | Status: DC | PRN

## 2016-05-14 MED ORDER — OXYCODONE HCL 5 MG PO TABS: 5.0000 mg | ORAL_TABLET | ORAL | Status: DC | PRN

## 2016-05-14 MED ORDER — LACTATED RINGERS IV SOLN
INTRAVENOUS | Status: DC
  Administered 2016-05-14 (×3): via INTRAVENOUS

## 2016-05-14 MED ORDER — OXYTOCIN BOLUS FROM INFUSION
500.0000 mL | Freq: Once | INTRAVENOUS | Status: AC
  Administered 2016-05-14: 500 mL via INTRAVENOUS

## 2016-05-14 MED ORDER — ONDANSETRON HCL 4 MG PO TABS: 4.0000 mg | ORAL_TABLET | ORAL | Status: DC | PRN

## 2016-05-14 MED ORDER — SENNOSIDES-DOCUSATE SODIUM 8.6-50 MG PO TABS
2.0000 | ORAL_TABLET | ORAL | Status: DC
  Administered 2016-05-14 – 2016-05-16 (×2): 2 via ORAL
  Filled 2016-05-14 (×2): qty 2

## 2016-05-14 MED ORDER — IBUPROFEN 600 MG PO TABS
600.0000 mg | ORAL_TABLET | Freq: Four times a day (QID) | ORAL | Status: DC
  Administered 2016-05-14 – 2016-05-16 (×7): 600 mg via ORAL
  Filled 2016-05-14 (×7): qty 1

## 2016-05-14 NOTE — Anesthesia Pain Management Evaluation Note (Signed)
  CRNA Pain Management Visit Note  Patient: Dana Snyder, 25 y.o., female  "Hello I am a member of the anesthesia team at [facility]. We have an anesthesia team available at all times to provide care throughout the hospital, including epidural management and anesthesia for C-section. I don't know your plan for the delivery whether it a natural birth, water birth, IV sedation, nitrous supplementation, doula or epidural, but we want to meet your pain goals."   1.Was your pain managed to your expectations on prior hospitalizations?   No prior hospitalizations  2.What is your expectation for pain management during this hospitalization?     Epidural  3.How can we help you reach that goal? epidural  Record the patient's initial score and the patient's pain goal.   Pain: 3  Pain Goal: 5 The [facility] wants you to be able to say your pain was always managed very well.  Edison Pace 05/14/2016

## 2016-05-14 NOTE — Progress Notes (Signed)
Patient ID: Dana Snyder, female   DOB: [birth year], 25 y.o.   MRN: [id number]  Ready for IOL, some ctx  AFVSS gen NAD FHTs 140's, min/mod var, category 1 toco irr  SVE 3/50/-2  AROM for clear fluid, w/o diff/comp  Continue IOL - add pitocin if no cervical change in 2 hours.  Recc epidural.

## 2016-05-14 NOTE — Anesthesia Preprocedure Evaluation (Signed)
Anesthesia Evaluation  Patient identified by MRN, date of birth, ID band Patient awake    Reviewed: Allergy & Precautions, NPO status , Patient's Chart, lab work & pertinent test results  History of Anesthesia Complications Negative for: history of anesthetic complications  Airway Mallampati: II  TM Distance: >3 FB Neck ROM: Full    Dental  (+) Dental Advisory Given   Pulmonary neg pulmonary ROS, former smoker,    breath sounds clear to auscultation       Cardiovascular negative cardio ROS   Rhythm:Regular Rate:Normal     Neuro/Psych  Headaches,    GI/Hepatic Neg liver ROS, GERD  ,  Endo/Other  Morbid obesity  Renal/GU negative Renal ROS     Musculoskeletal   Abdominal (+) + obese,   Peds  Hematology Hb 10.3, plt 222K   Anesthesia Other Findings   Reproductive/Obstetrics (+) Pregnancy                             Anesthesia Physical Anesthesia Plan  ASA: II  Anesthesia Plan: Epidural   Post-op Pain Management:    Induction:   Airway Management Planned: Natural Airway  Additional Equipment:   Intra-op Plan:   Post-operative Plan:   Informed Consent: I have reviewed the patients History and Physical, chart, labs and discussed the procedure including the risks, benefits and alternatives for the proposed anesthesia with the patient or authorized representative who has indicated his/her understanding and acceptance.     Plan Discussed with: CRNA and Surgeon  Anesthesia Plan Comments: (Patient identified. Risks/Benefits/Options discussed with patient including but not limited to bleeding, infection, nerve damage, paralysis, failed block, incomplete pain control, headache, blood pressure changes, nausea, vomiting, reactions to medication both or allergic, itching and postpartum back pain. Confirmed with bedside nurse the patient's most recent platelet count. Confirmed with patient  that they are not currently taking any anticoagulation, have any bleeding history or any family history of bleeding disorders. Patient expressed understanding and wished to proceed. All questions were answered. )        Anesthesia Quick Evaluation

## 2016-05-14 NOTE — Progress Notes (Signed)
Patient ID: Dana Snyder, female   DOB: May 11, 1991, 25 y.o.   MRN: [id number]

## 2016-05-14 NOTE — Progress Notes (Signed)
Patient ID: Dana Snyder, female   DOB: 10-13-1991, 25 y.o.   MRN: [id number]  Pt comfortable with epidural  AFVSS gen NAD FHTs 140's mod var, some early decel, category 1 toco Q 2-70min  SVE 7.8/90/0-+1  Doing well, continue IOL

## 2016-05-14 NOTE — Anesthesia Procedure Notes (Signed)
Epidural Patient location during procedure: OB Start time: 05/14/2016 2:00 PM End time: 05/14/2016 2:57 PM  Staffing Anesthesiologist: Jairo Ben Performed: anesthesiologist   Preanesthetic Checklist Completed: patient identified, surgical consent, pre-op evaluation, timeout performed, IV checked, risks and benefits discussed and monitors and equipment checked  Epidural Patient position: sitting Prep: site prepped and draped and DuraPrep Patient monitoring: heart rate, continuous pulse ox and blood pressure Approach: midline Location: L2-L3 Injection technique: LOR air  Needle:  Needle type: Tuohy  Needle gauge: 17 G Needle length: 9 cm Needle insertion depth: 9 cm Catheter type: closed end flexible Catheter at skin depth: 12 cm Test dose: negative (1% lidocaine)  Additional Notes Pt identified in Labor room.  Monitors applied. Working IV access confirmed. Sterile prep, drape lumbar spine.  1% lido local L 2,3.  Very difficult to enter epidural space, multiple attempts, multiple spaces, all os.#17ga Touhy LOR air at 9 cm L 2,3, cath in easily to 16 cm, pulled back to 12 cm skin. Test dose OK, cath dosed and infusion begun.  Patient asymptomatic, VSS, no heme aspirated, tolerated well.  Sandford Craze, MDReason for block:procedure for pain

## 2016-05-15 LAB — CBC
HEMATOCRIT: 28.6 % — AB (ref 36.0–46.0)
Hemoglobin: 9.7 g/dL — ABNORMAL LOW (ref 12.0–15.0)
MCH: 30.5 pg (ref 26.0–34.0)
MCHC: 33.9 g/dL (ref 30.0–36.0)
MCV: 89.9 fL (ref 78.0–100.0)
Platelets: 229 10*3/uL (ref 150–400)
RBC: 3.18 MIL/uL — ABNORMAL LOW (ref 3.87–5.11)
RDW: 13.9 % (ref 11.5–15.5)
WBC: 13.8 10*3/uL — AB (ref 4.0–10.5)

## 2016-05-15 MED ORDER — RHO D IMMUNE GLOBULIN 1500 UNIT/2ML IJ SOSY
300.0000 ug | PREFILLED_SYRINGE | Freq: Once | INTRAMUSCULAR | Status: AC
  Administered 2016-05-15: 300 ug via INTRAVENOUS
  Filled 2016-05-15: qty 2

## 2016-05-15 NOTE — Anesthesia Postprocedure Evaluation (Signed)
Anesthesia Post Note  Patient: Dana Snyder  Procedure(s) Performed: * No procedures listed *  Patient location during evaluation: Mother Baby Anesthesia Type: Epidural Level of consciousness: awake and alert and oriented Pain management: satisfactory to patient Vital Signs Assessment: post-procedure vital signs reviewed and stable Respiratory status: spontaneous breathing and nonlabored ventilation Cardiovascular status: stable Postop Assessment: no headache, no backache, no signs of nausea or vomiting, adequate PO intake and patient able to bend at knees (patient up walking) Anesthetic complications: no     Last Vitals:  Vitals:   05/14/16 2152 05/15/16 0133  BP: 111/67 (!) 98/57  Pulse: 88 80  Resp: 20 18  Temp: 37.1 C 36.7 C    Last Pain:  Vitals:   05/15/16 0757  TempSrc:   PainSc: 0-No pain   Pain Goal: Patients Stated Pain Goal: 2 (05/14/16 2313)               Madison Hickman

## 2016-05-15 NOTE — Progress Notes (Signed)
UR chart review completed.  

## 2016-05-15 NOTE — Progress Notes (Signed)
Post Partum Day 1 Subjective: no complaints, up ad lib and tolerating PO  Objective: Blood pressure (!) 98/57, pulse 80, temperature 98.1 F (36.7 C), temperature source Oral, resp. rate 18, height 5\' 4"  (1.626 m), weight 104.3 kg (230 lb), last menstrual period 08/10/2015, SpO2 97 %, unknown if currently breastfeeding.  Physical Exam:  General: alert and cooperative Lochia: appropriate Uterine Fundus: firm    Recent Labs  05/14/16 0800 05/15/16 0510  HGB 10.3* 9.7*  HCT 30.6* 28.6*    Assessment/Plan: Plan for discharge tomorrow  Baby sleepy, wants to work on breastfeeding   LOS: 1 day   Dana Snyder W 05/15/2016, 7:58 AM

## 2016-05-16 ENCOUNTER — Encounter (HOSPITAL_COMMUNITY)
Admission: RE | Admit: 2016-05-16 | Discharge: 2016-05-16 | Disposition: A | Discharge status: Home / Self Care | Payer: Medicaid Other | Source: Ambulatory Visit

## 2016-05-16 HISTORY — DX: Personal history of other infectious and parasitic diseases: Z86.19

## 2016-05-16 MED ORDER — OXYCODONE HCL 5 MG PO TABS: 5.0000 mg | ORAL_TABLET | ORAL | 0 refills | Status: DC | PRN

## 2016-05-16 MED ORDER — IBUPROFEN 600 MG PO TABS: 600.0000 mg | ORAL_TABLET | Freq: Four times a day (QID) | ORAL | 0 refills | Status: AC

## 2016-05-16 NOTE — Progress Notes (Signed)
PPD #2 Doing well Afeb, VSS Fundus firm D/c home 

## 2016-05-16 NOTE — Discharge Instructions (Signed)
As per discharge pamphlet °

## 2016-05-16 NOTE — Discharge Summary (Signed)
OB Discharge Summary     Patient Name: Dana Snyder DOB: October 28, 1990 MRN: [id number]  Date of admission: 05/14/2016 Delivering MD: Sherian Rein   Date of discharge: 05/16/2016  Admitting diagnosis: INDUCTION Intrauterine pregnancy: [redacted]w[redacted]d     Secondary diagnosis:  Principal Problem:   VBAC, delivered, current hospitalization Active Problems:   Normal pregnancy in multigravida in third trimester   Encounter for trial of labor      Discharge diagnosis: Term Pregnancy Delivered and VBAC                                                                                                 Augmentation: AROM and Pitocin  Complications: None  Hospital course:  Induction of Labor With Vaginal Delivery   25 y.o. yo [phone] at [redacted]w[redacted]d was admitted to the hospital 05/14/2016 for induction of labor.  Indication for induction: Favorable cervix at term.  Patient had an uncomplicated labor course as follows: Membrane Rupture Time/Date: 8:44 AM ,05/14/2016   Intrapartum Procedures: Episiotomy: None [1]                                         Lacerations:  Labial [10]  Patient had delivery of a Viable infant.  Information for the patient's newborn:  Philis Kendall Girl Narelle [[id number]]  Delivery Method: VBAC   05/14/2016  Details of delivery can be found in separate delivery note.  Patient had a routine postpartum course. Patient is discharged home 05/16/16.   Physical exam Vitals:   05/14/16 2152 05/15/16 0133 05/15/16 1911 05/16/16 0609  BP: 111/67 (!) 98/57 106/71 100/63  Pulse: 88 80 87 85  Resp: Temp: 98.8 F (37.1 C) 98.1 F (36.7 C) 98.3 F (36.8 C) 97.6 F (36.4 C)  TempSrc: Oral Oral Oral Oral  SpO2: 98% 97%    Weight:      Height:       General: alert Lochia: appropriate Uterine Fundus: firm  Labs: Lab Results  Component Value Date   WBC 13.8 (H) 05/15/2016   HGB 9.7 (L) 05/15/2016   HCT 28.6 (L) 05/15/2016   MCV 89.9 05/15/2016   PLT 229 05/15/2016    CMP Latest Ref Rng & Units 09/20/2012  Glucose 70 - 99 mg/dL 96  BUN 6 - 23 mg/dL 14  Creatinine 6.96 - 2.95 mg/dL 2.84  Sodium 132 - 440 mEq/L 138  Potassium 3.5 - 5.1 mEq/L 4.0  Chloride 96 - 112 mEq/L 104  CO2 19 - 32 mEq/L 27  Calcium 8.4 - 10.5 mg/dL 9.0  Total Protein 6.0 - 8.3 g/dL 7.1  Total Bilirubin 0.3 - 1.2 mg/dL 0.7  Alkaline Phos 39 - 117 U/L 53  AST 0 - 37 U/L 17  ALT 0 - 35 U/L 17    Discharge instruction: per After Visit Summary and "Baby and Me Booklet".  After visit meds:    Medication List    TAKE these medications   acetaminophen 500 MG tablet Commonly  known as:  TYLENOL Take 500 mg by mouth every 6 (six) hours as needed for mild pain or headache.   ibuprofen 600 MG tablet Commonly known as:  ADVIL,MOTRIN Take 1 tablet (600 mg total) by mouth every 6 (six) hours.   oxyCODONE 5 MG immediate release tablet Commonly known as:  Oxy IR/ROXICODONE Take 1 tablet (5 mg total) by mouth every 4 (four) hours as needed (pain scale 4-7).   prenatal multivitamin Tabs tablet Take 1 tablet by mouth at bedtime.       Diet: routine diet  Activity: Advance as tolerated. Pelvic rest for 6 weeks.   Outpatient follow up:4 weeks   Newborn Data: Live born female  Birth Weight: 7 lb 15.7 oz (3620 g) APGAR: 8, 9  Baby Feeding: Breast Disposition:home with mother   05/16/2016 Zenaida Niece, MD

## 2016-05-17 ENCOUNTER — Encounter (HOSPITAL_COMMUNITY): Admission: RE | Payer: Self-pay | Source: Ambulatory Visit

## 2016-05-17 ENCOUNTER — Inpatient Hospital Stay (HOSPITAL_COMMUNITY)
Admission: RE | Admit: 2016-05-17 | Payer: Medicaid Other | Source: Ambulatory Visit | Admitting: Obstetrics and Gynecology

## 2016-05-17 SURGERY — Surgical Case
Anesthesia: Regional

## 2016-05-18 LAB — RH IG WORKUP (INCLUDES ABO/RH)
ABO/RH(D): O NEG
FETAL SCREEN: NEGATIVE
Gestational Age(Wks): 39.5
UNIT DIVISION: 0

## 2016-09-09 ENCOUNTER — Telehealth (HOSPITAL_COMMUNITY): Payer: Self-pay | Admitting: *Deleted

## 2016-09-09 NOTE — Telephone Encounter (Signed)
Attempted to call patient to schedule Pap smear appointment with [facility]. Patient's home phone number was busy and was unable to leave message on cell phone.

## 2017-08-08 ENCOUNTER — Encounter (HOSPITAL_COMMUNITY): Payer: Self-pay

## 2017-08-08 ENCOUNTER — Emergency Department (HOSPITAL_COMMUNITY)
Admission: EM | Admit: 2017-08-08 | Discharge: 2017-08-09 | Disposition: A | Discharge status: Home / Self Care | Payer: Medicaid Other | Attending: Emergency Medicine | Admitting: Emergency Medicine

## 2017-08-08 DIAGNOSIS — Z87891 Personal history of nicotine dependence: Secondary | ICD-10-CM | POA: Diagnosis not present

## 2017-08-08 DIAGNOSIS — R51 Headache: Secondary | ICD-10-CM | POA: Diagnosis not present

## 2017-08-08 DIAGNOSIS — R519 Headache, unspecified: Secondary | ICD-10-CM

## 2017-08-08 DIAGNOSIS — N390 Urinary tract infection, site not specified: Secondary | ICD-10-CM

## 2017-08-08 LAB — POC URINE PREG, ED: PREG TEST UR: NEGATIVE

## 2017-08-08 NOTE — ED Triage Notes (Signed)
Pt complains of a migraine headache and low back and spine pain

## 2017-08-09 LAB — URINALYSIS, ROUTINE W REFLEX MICROSCOPIC
Bilirubin Urine: NEGATIVE
Glucose, UA: NEGATIVE mg/dL
Hgb urine dipstick: NEGATIVE
Ketones, ur: NEGATIVE mg/dL
Nitrite: POSITIVE — AB
PROTEIN: NEGATIVE mg/dL
Specific Gravity, Urine: 1.028 (ref 1.005–1.030)
WBC, UA: NONE SEEN WBC/hpf (ref 0–5)
pH: 5 (ref 5.0–8.0)

## 2017-08-09 LAB — I-STAT BETA HCG BLOOD, ED (MC, WL, AP ONLY)

## 2017-08-09 MED ORDER — KETOROLAC TROMETHAMINE 30 MG/ML IJ SOLN
30.0000 mg | Freq: Once | INTRAMUSCULAR | Status: AC
  Administered 2017-08-09: 30 mg via INTRAVENOUS
  Filled 2017-08-09: qty 1

## 2017-08-09 MED ORDER — SODIUM CHLORIDE 0.9 % IV SOLN
INTRAVENOUS | Status: DC
  Administered 2017-08-09: 01:00:00 via INTRAVENOUS

## 2017-08-09 MED ORDER — DIPHENHYDRAMINE HCL 50 MG/ML IJ SOLN
12.5000 mg | Freq: Once | INTRAMUSCULAR | Status: AC
Start: 2017-08-09 — End: 2017-08-09
  Administered 2017-08-09: 12.5 mg via INTRAVENOUS
  Filled 2017-08-09: qty 1

## 2017-08-09 MED ORDER — CEPHALEXIN 500 MG PO CAPS: 500.0000 mg | ORAL_CAPSULE | Freq: Two times a day (BID) | ORAL | 0 refills | Status: DC

## 2017-08-09 MED ORDER — PROCHLORPERAZINE EDISYLATE 5 MG/ML IJ SOLN
10.0000 mg | Freq: Once | INTRAMUSCULAR | Status: AC
  Administered 2017-08-09: 10 mg via INTRAVENOUS
  Filled 2017-08-09: qty 2

## 2017-08-09 NOTE — ED Provider Notes (Signed)
[facility]-EMERGENCY DEPT Provider Note   CSN: [id number] Arrival date & time: 08/08/17  2051     History   Chief Complaint Chief Complaint  Patient presents with  . Headache    HPI Dana Snyder is a 26 y.o. female.  Patient presents to the emergency department with a chief complaint of headache.  Reports history of headaches/migraines.  She states that she has had a headache that started 2 days ago.  She states that it has gradually worsened.  No vision changes, slurred speech, numbness, weakness, or tingling.  She states that this feels similar to her prior migraines, but is stronger than normal.  Additionally she reports urinary frequency and urgency, but denies any dysuria.  She does complain of some low back pain.  She denies any fevers, chills, nausea, or vomiting.   The history is provided by the patient. No language interpreter was used.    Past Medical History:  Diagnosis Date  . Encounter for trial of labor 05/13/2016  . History of shingles   . Kidney infection   . Migraines   . Vaginal Pap smear, abnormal   . VBAC, delivered, current hospitalization 05/14/2016    Patient Active Problem List   Diagnosis Date Noted  . VBAC, delivered, current hospitalization 05/14/2016  . Normal pregnancy in multigravida in third trimester 05/13/2016  . Encounter for trial of labor 05/13/2016  . ASCUS with positive high risk HPV cervical 08/28/2015    Past Surgical History:  Procedure Laterality Date  . CESAREAN SECTION  2011  . KNEE SURGERY  2009  . WRIST SURGERY  2006   cyst removed    OB History    Gravida Para Term Preterm AB Living   4 3 3   1 3    SAB TAB Ectopic Multiple Live Births         0 1       Home Medications    Prior to Admission medications   Medication Sig Start Date End Date Taking? Authorizing Provider  acetaminophen (TYLENOL) 500 MG tablet Take 500 mg by mouth every 6 (six) hours as needed for mild pain or headache.      [provider]  ibuprofen (ADVIL,MOTRIN) 600 MG tablet Take 1 tablet (600 mg total) by mouth every 6 (six) hours. 05/16/16   Meisinger, Tawanna Cooler, MD  oxyCODONE (OXY IR/ROXICODONE) 5 MG immediate release tablet Take 1 tablet (5 mg total) by mouth every 4 (four) hours as needed (pain scale 4-7). 05/16/16   Meisinger, Tawanna Cooler, MD  Prenatal Vit-Fe Fumarate-FA (PRENATAL MULTIVITAMIN) TABS tablet Take 1 tablet by mouth at bedtime.    [provider]    Family History Family History  Problem Relation Age of Onset  . Hypertension Mother   . Diabetes Father   . Hypertension Father   . Stroke Paternal Grandmother     Social History Social History  Substance Use Topics  . Smoking status: Former Games developer  . Smokeless tobacco: Never Used  . Alcohol use Yes     Comment: Occasional prior to pregnancy     Allergies   Darvocet [propoxyphene n-acetaminophen]   Review of Systems Review of Systems  All other systems reviewed and are negative.    Physical Exam Updated Vital Signs BP 122/79 (BP Location: Left Arm)   Pulse 75   Temp 98.4 F (36.9 C) (Oral)   Resp 16   Ht 5\' 4"  (1.626 m)   Wt 77.1 kg (170 lb)  LMP 07/15/2017   SpO2 98%   BMI 29.18 kg/m   Physical Exam  Constitutional: She is oriented to person, place, and time. She appears well-developed and well-nourished.  HENT:  Head: Normocephalic and atraumatic.  Right Ear: External ear normal.  Left Ear: External ear normal.  Eyes: Pupils are equal, round, and reactive to light. Conjunctivae and EOM are normal.  Neck: Normal range of motion. Neck supple.  No pain with neck flexion, no meningismus  Cardiovascular: Normal rate, regular rhythm and normal heart sounds.  Exam reveals no gallop and no friction rub.   No murmur heard. Pulmonary/Chest: Effort normal and breath sounds normal. No respiratory distress. She has no wheezes. She has no rales. She exhibits no tenderness.  Abdominal: Soft. Bowel sounds are normal.  She exhibits no distension and no mass. There is no tenderness. There is no rebound and no guarding.  Musculoskeletal: Normal range of motion. She exhibits no edema or tenderness.  Normal gait.  Neurological: She is alert and oriented to person, place, and time. She has normal reflexes.  CN 3-12 intact, normal finger to nose, no pronator drift, sensation and strength intact bilaterally.  Skin: Skin is warm and dry.  Psychiatric: She has a normal mood and affect. Her behavior is normal. Judgment and thought content normal.  Nursing note and vitals reviewed.    ED Treatments / Results  Labs (all labs ordered are listed, but only abnormal results are displayed) Labs Reviewed  URINALYSIS, ROUTINE W REFLEX MICROSCOPIC - Abnormal; Notable for the following:       Result Value   APPearance HAZY (*)    Nitrite POSITIVE (*)    Leukocytes, UA SMALL (*)    Bacteria, UA MANY (*)    Squamous Epithelial / LPF 6-30 (*)    All other components within normal limits  POC URINE PREG, ED    EKG  EKG Interpretation None       Radiology No results found.  Procedures Procedures (including critical care time)  Medications Ordered in ED Medications  0.9 %  sodium chloride infusion ( Intravenous New Bag/Given 08/09/17 0037)  ketorolac (TORADOL) 30 MG/ML injection 30 mg (not administered)  diphenhydrAMINE (BENADRYL) injection 12.5 mg (12.5 mg Intravenous Given 08/09/17 0040)  prochlorperazine (COMPAZINE) injection 10 mg (10 mg Intravenous Given 08/09/17 0037)     Initial Impression / Assessment and Plan / ED Course  I have reviewed the triage vital signs and the nursing notes.  Pertinent labs & imaging results that were available during my care of the patient were reviewed by me and considered in my medical decision making (see chart for details).     Pt HA treated and improved while in ED.  Presentation is like pts typical HA and non concerning for [facility], ICH, Meningitis, or temporal  arteritis. Pt is afebrile with no focal neuro deficits, nuchal rigidity, or change in vision.  It appears that the patient does also have a UTI.  Will treat this as well. Pt is to follow up with PCP to discuss prophylactic medication. Pt verbalizes understanding and is agreeable with plan to dc.    Final Clinical Impressions(s) / ED Diagnoses   Final diagnoses:  Nonintractable headache, unspecified chronicity pattern, unspecified headache type  Urinary tract infection without hematuria, site unspecified    New Prescriptions New Prescriptions   CEPHALEXIN (KEFLEX) 500 MG CAPSULE    Take 1 capsule (500 mg total) by mouth 2 (two) times daily.     Roxy HorsemanBrowning, Kashlyn Salinas,  PA-C 08/09/17 0059    Molpus, Jonny Ruiz, MD 08/09/17 [phone]

## 2019-03-08 ENCOUNTER — Other Ambulatory Visit: Payer: Self-pay

## 2019-03-08 ENCOUNTER — Emergency Department (HOSPITAL_COMMUNITY)
Admission: EM | Admit: 2019-03-08 | Discharge: 2019-03-09 | Disposition: A | Discharge status: Home / Self Care | Payer: Self-pay | Attending: Emergency Medicine | Admitting: Emergency Medicine

## 2019-03-08 ENCOUNTER — Encounter (HOSPITAL_COMMUNITY): Payer: Self-pay

## 2019-03-08 DIAGNOSIS — R109 Unspecified abdominal pain: Secondary | ICD-10-CM | POA: Insufficient documentation

## 2019-03-08 DIAGNOSIS — R509 Fever, unspecified: Secondary | ICD-10-CM | POA: Insufficient documentation

## 2019-03-08 DIAGNOSIS — R300 Dysuria: Secondary | ICD-10-CM

## 2019-03-08 DIAGNOSIS — Z87891 Personal history of nicotine dependence: Secondary | ICD-10-CM | POA: Insufficient documentation

## 2019-03-08 DIAGNOSIS — M545 Low back pain: Secondary | ICD-10-CM | POA: Insufficient documentation

## 2019-03-08 LAB — URINALYSIS, ROUTINE W REFLEX MICROSCOPIC
Bacteria, UA: NONE SEEN
Glucose, UA: NEGATIVE mg/dL
Hgb urine dipstick: NEGATIVE
Ketones, ur: 5 mg/dL — AB
Nitrite: NEGATIVE
Protein, ur: 30 mg/dL — AB
Specific Gravity, Urine: 1.036 — ABNORMAL HIGH (ref 1.005–1.030)
pH: 5 (ref 5.0–8.0)

## 2019-03-08 LAB — I-STAT BETA HCG BLOOD, ED (MC, WL, AP ONLY): I-stat hCG, quantitative: <5 m[IU]/mL (ref <5)

## 2019-03-08 LAB — CBC
HCT: 42 % (ref 36.0–46.0)
Hemoglobin: 13.7 g/dL (ref 12.0–15.0)
MCH: 30 pg (ref 26.0–34.0)
MCHC: 32.6 g/dL (ref 30.0–36.0)
MCV: 92.1 fL (ref 80.0–100.0)
Platelets: 220 10*3/uL (ref 150–400)
RBC: 4.56 MIL/uL (ref 3.87–5.11)
RDW: 12.2 % (ref 11.5–15.5)
WBC: 7.9 10*3/uL (ref 4.0–10.5)
nRBC: 0 % (ref 0.0–0.2)

## 2019-03-08 MED ORDER — SODIUM CHLORIDE 0.9 % IV BOLUS
1000.0000 mL | Freq: Once | INTRAVENOUS | Status: AC
  Administered 2019-03-08: 1000 mL via INTRAVENOUS

## 2019-03-08 MED ORDER — KETOROLAC TROMETHAMINE 15 MG/ML IJ SOLN
15.0000 mg | Freq: Once | INTRAMUSCULAR | Status: AC
  Administered 2019-03-08: 15 mg via INTRAVENOUS
  Filled 2019-03-08: qty 1

## 2019-03-08 NOTE — ED Triage Notes (Signed)
Pt reports flank pain, burning while urinating, and fever that started today. Pt reports taking Tylenol right before she came here. Pt reports hx of kidney infections.

## 2019-03-09 ENCOUNTER — Emergency Department (HOSPITAL_COMMUNITY): Payer: Self-pay

## 2019-03-09 LAB — BASIC METABOLIC PANEL
Anion gap: 8 (ref 5–15)
BUN: 15 mg/dL (ref 6–20)
CO2: 23 mmol/L (ref 22–32)
Calcium: 9.1 mg/dL (ref 8.9–10.3)
Chloride: 107 mmol/L (ref 98–111)
Creatinine, Ser: 0.97 mg/dL (ref 0.44–1.00)
GFR calc Af Amer: >60 mL/min (ref >60)
GFR calc non Af Amer: >60 mL/min (ref >60)
Glucose, Bld: 99 mg/dL (ref 70–99)
Potassium: 3.7 mmol/L (ref 3.5–5.1)
Sodium: 138 mmol/L (ref 135–145)

## 2019-03-09 MED ORDER — NAPROXEN 500 MG PO TABS: 500.0000 mg | ORAL_TABLET | Freq: Two times a day (BID) | ORAL | 0 refills | Status: DC

## 2019-03-09 MED ORDER — CEPHALEXIN 500 MG PO CAPS: 500.0000 mg | ORAL_CAPSULE | Freq: Three times a day (TID) | ORAL | 0 refills | Status: DC

## 2019-03-09 NOTE — ED Provider Notes (Signed)
[facility]-EMERGENCY DEPT Provider Note   CSN: [id number] Arrival date & time: 03/08/19  2249    History   Chief Complaint Chief Complaint  Patient presents with  . Flank Pain  . Fever    HPI Dana Snyder is a 28 y.o. female.     HPI  This is a 28 year old female who presents with dysuria and lower back pain.  Patient reports onset of symptoms over the last 1 to 2 days.  She reports bilateral lower back pain right greater than left that is nonradiating.  Subsequently she has developed dysuria.  No hematuria.  She reports fever today to 102.  She took Tylenol with some relief.  Currently she rates her pain at 8 out of 10.  Denies any vaginal bleeding or vaginal discharge.  No concerns for STDs.  Last menstrual period was 3 weeks ago.  Past Medical History:  Diagnosis Date  . Encounter for trial of labor 05/13/2016  . History of shingles   . Kidney infection   . Migraines   . Vaginal Pap smear, abnormal   . VBAC, delivered, current hospitalization 05/14/2016    Patient Active Problem List   Diagnosis Date Noted  . VBAC, delivered, current hospitalization 05/14/2016  . Normal pregnancy in multigravida in third trimester 05/13/2016  . Encounter for trial of labor 05/13/2016  . ASCUS with positive high risk HPV cervical 08/28/2015    Past Surgical History:  Procedure Laterality Date  . CESAREAN SECTION  2011  . KNEE SURGERY  2009  . WRIST SURGERY  2006   cyst removed     OB History    Gravida  4   Para  3   Term  3   Preterm      AB  1   Living  3     SAB      TAB      Ectopic      Multiple  0   Live Births  1            Home Medications    Prior to Admission medications   Medication Sig Start Date End Date Taking? Authorizing Provider  acetaminophen (TYLENOL) 325 MG tablet Take 650 mg by mouth every 6 (six) hours as needed for moderate pain.   Yes [provider]  aspirin-acetaminophen-caffeine (EXCEDRIN  MIGRAINE) [phone] MG tablet Take 1 tablet by mouth every 6 (six) hours as needed for headache.   Yes [provider]  levonorgestrel (MIRENA, 52 MG,) 20 MCG/24HR IUD 1 each by Intrauterine route once.  06/04/16  Yes [provider]  cephALEXin (KEFLEX) 500 MG capsule Take 1 capsule (500 mg total) by mouth 3 (three) times daily. 03/09/19   Horton, Mayer Masker, MD  ibuprofen (ADVIL,MOTRIN) 600 MG tablet Take 1 tablet (600 mg total) by mouth every 6 (six) hours. Patient not taking: Reported on 03/09/2019 05/16/16   Meisinger, Tawanna Cooler, MD  naproxen (NAPROSYN) 500 MG tablet Take 1 tablet (500 mg total) by mouth 2 (two) times daily. 03/09/19   Horton, Mayer Masker, MD  oxyCODONE (OXY IR/ROXICODONE) 5 MG immediate release tablet Take 1 tablet (5 mg total) by mouth every 4 (four) hours as needed (pain scale 4-7). Patient not taking: Reported on 03/09/2019 05/16/16   Lavina Hamman, MD    Family History Family History  Problem Relation Age of Onset  . Hypertension Mother   . Diabetes Father   . Hypertension Father   . Stroke Paternal Grandmother  Social History Social History   Tobacco Use  . Smoking status: Former Games developer  . Smokeless tobacco: Never Used  Substance Use Topics  . Alcohol use: Yes    Comment: Occasional prior to pregnancy  . Drug use: No     Allergies   Darvocet [propoxyphene n-acetaminophen]   Review of Systems Review of Systems  Constitutional: Positive for chills and fever.  Respiratory: Negative for shortness of breath.   Cardiovascular: Negative for chest pain.  Gastrointestinal: Positive for nausea. Negative for abdominal pain and vomiting.  Genitourinary: Positive for dysuria. Negative for hematuria.  Musculoskeletal: Positive for back pain.  All other systems reviewed and are negative.    Physical Exam Updated Vital Signs BP (!) 94/57 (BP Location: Right Arm)   Pulse 98   Temp 99.4 F (37.4 C) (Oral)   Resp 16   SpO2 97%   Physical Exam  Vitals signs and nursing note reviewed.  Constitutional:      Appearance: She is well-developed. She is obese.  HENT:     Head: Normocephalic and atraumatic.  Eyes:     Pupils: Pupils are equal, round, and reactive to light.  Neck:     Musculoskeletal: Neck supple.  Cardiovascular:     Rate and Rhythm: Normal rate and regular rhythm.     Heart sounds: Normal heart sounds.  Pulmonary:     Effort: Pulmonary effort is normal. No respiratory distress.     Breath sounds: No wheezing.  Abdominal:     General: Bowel sounds are normal.     Palpations: Abdomen is soft.     Tenderness: There is right CVA tenderness.  Musculoskeletal:     Right lower leg: No edema.     Left lower leg: No edema.  Skin:    General: Skin is warm and dry.  Neurological:     Mental Status: She is alert and oriented to person, place, and time.  Psychiatric:        Mood and Affect: Mood normal.      ED Treatments / Results  Labs (all labs ordered are listed, but only abnormal results are displayed) Labs Reviewed  URINALYSIS, ROUTINE W REFLEX MICROSCOPIC - Abnormal; Notable for the following components:      Result Value   Color, Urine AMBER (*)    APPearance HAZY (*)    Specific Gravity, Urine 1.036 (*)    Bilirubin Urine SMALL (*)    Ketones, ur 5 (*)    Protein, ur 30 (*)    Leukocytes,Ua TRACE (*)    All other components within normal limits  URINE CULTURE  BASIC METABOLIC PANEL  CBC  I-STAT BETA HCG BLOOD, ED ([facility], [facility], [facility] ONLY)    EKG None  Radiology Ct Renal Stone Study  Result Date: 03/09/2019 CLINICAL DATA:  Flank pain.  Stone disease suspected. EXAM: CT ABDOMEN AND PELVIS WITHOUT CONTRAST TECHNIQUE: Multidetector CT imaging of the abdomen and pelvis was performed following the standard protocol without IV contrast. COMPARISON:  None. FINDINGS: Lower chest: No acute abnormality. Hepatobiliary: No focal liver abnormality is seen. No gallstones, gallbladder wall thickening, or biliary  dilatation. Pancreas: Unremarkable. No pancreatic ductal dilatation or surrounding inflammatory changes. Spleen: Normal in size without focal abnormality. Adrenals/Urinary Tract: Adrenal glands are unremarkable. Kidneys are normal, without renal calculi, focal lesion, or hydronephrosis. Bladder is unremarkable. Stomach/Bowel: Stomach is within normal limits. Appendix appears normal. No evidence of bowel wall thickening, distention, or inflammatory changes. Vascular/Lymphatic: No significant vascular findings are present.  No enlarged abdominal or pelvic lymph nodes. Reproductive: There is an IUD in place. No ovarian mass. There is a trace amount of free fluid in the pelvis likely physiologic. Other: No abdominal wall hernia or abnormality. No abdominopelvic ascites. Musculoskeletal: No acute or significant osseous findings. IMPRESSION: 1. No acute abnormality detected. No findings to explain the patient's symptoms. There are no radiopaque kidney stones. There is no hydronephrosis. 2. Normal appendix in the right lower quadrant. 3. IUD in place. Electronically Signed   By: Katherine Mantlehristopher  Green M.D.   On: 03/09/2019 01:23    Procedures Procedures (including critical care time)  Medications Ordered in ED Medications  sodium chloride 0.9 % bolus 1,000 mL (0 mLs Intravenous Stopped 03/09/19 0129)  ketorolac (TORADOL) 15 MG/ML injection 15 mg (15 mg Intravenous Given 03/08/19 2358)     Initial Impression / Assessment and Plan / ED Course  I have reviewed the triage vital signs and the nursing notes.  Pertinent labs & imaging results that were available during my care of the patient were reviewed by me and considered in my medical decision making (see chart for details).        Patient presents with back pain, dysuria, reported fevers at home.  Reports her symptoms are consistent with prior kidney infections.  She is overall nontoxic-appearing.  She is initially afebrile.  Blood pressure soft at 94/57.   However, she is otherwise asymptomatic.  Work-up initiated.  No leukocytosis.  No significant metabolic derangements.  Her urine appears clean with only 0-5 white cells and no bacteria seen.  Will send culture given that her symptoms are consistent with her prior UTIs.  CT renal stone study obtained and shows no evidence of kidney stones.  On recheck, she states she feels somewhat better.  Would favor treating for UTI given her symptoms and have her follow-up closely.  Her fever may or may not be related.  She denies any other infectious symptoms or sick contacts.  After history, exam, and medical workup I feel the patient has been appropriately medically screened and is safe for discharge home. Pertinent diagnoses were discussed with the patient. Patient was given return precautions.   Final Clinical Impressions(s) / ED Diagnoses   Final diagnoses:  Flank pain  Dysuria    ED Discharge Orders         Ordered    cephALEXin (KEFLEX) 500 MG capsule  3 times daily     03/09/19 0157    naproxen (NAPROSYN) 500 MG tablet  2 times daily     03/09/19 0157           Horton, Mayer Maskerourtney F, MD 03/09/19 [phone]

## 2019-03-09 NOTE — Discharge Instructions (Signed)
Your seen today for back pain and dysuria.  Your work-up is largely reassuring.  Your urinalysis does not look overtly infected; however, given your symptoms, it is reasonable to treat you for UTI.  Take naproxen as needed for pain.

## 2019-03-10 LAB — URINE CULTURE: Culture: <10000 — AB

## 2021-03-28 ENCOUNTER — Other Ambulatory Visit: Payer: Self-pay

## 2021-03-28 ENCOUNTER — Emergency Department (HOSPITAL_COMMUNITY)
Admission: EM | Admit: 2021-03-28 | Discharge: 2021-03-29 | Disposition: A | Discharge status: Home / Self Care | Payer: Self-pay | Attending: Emergency Medicine | Admitting: Emergency Medicine

## 2021-03-28 ENCOUNTER — Emergency Department (HOSPITAL_COMMUNITY): Payer: Self-pay

## 2021-03-28 ENCOUNTER — Encounter (HOSPITAL_COMMUNITY): Payer: Self-pay

## 2021-03-28 DIAGNOSIS — G43909 Migraine, unspecified, not intractable, without status migrainosus: Secondary | ICD-10-CM | POA: Insufficient documentation

## 2021-03-28 DIAGNOSIS — Z87891 Personal history of nicotine dependence: Secondary | ICD-10-CM | POA: Insufficient documentation

## 2021-03-28 DIAGNOSIS — G43009 Migraine without aura, not intractable, without status migrainosus: Secondary | ICD-10-CM

## 2021-03-28 LAB — CBC WITH DIFFERENTIAL/PLATELET
Abs Immature Granulocytes: 0.03 10*3/uL (ref 0.00–0.07)
Basophils Absolute: 0 10*3/uL (ref 0.0–0.1)
Basophils Relative: 0 %
Eosinophils Absolute: 0.1 10*3/uL (ref 0.0–0.5)
Eosinophils Relative: 1 %
HCT: 40.6 % (ref 36.0–46.0)
Hemoglobin: 13.4 g/dL (ref 12.0–15.0)
Immature Granulocytes: 0 %
Lymphocytes Relative: 14 %
Lymphs Abs: 1.3 10*3/uL (ref 0.7–4.0)
MCH: 30.8 pg (ref 26.0–34.0)
MCHC: 33 g/dL (ref 30.0–36.0)
MCV: 93.3 fL (ref 80.0–100.0)
Monocytes Absolute: 0.5 10*3/uL (ref 0.1–1.0)
Monocytes Relative: 5 %
Neutro Abs: 7.3 10*3/uL (ref 1.7–7.7)
Neutrophils Relative %: 80 %
Platelets: 244 10*3/uL (ref 150–400)
RBC: 4.35 MIL/uL (ref 3.87–5.11)
RDW: 12.3 % (ref 11.5–15.5)
WBC: 9.2 10*3/uL (ref 4.0–10.5)
nRBC: 0 % (ref 0.0–0.2)

## 2021-03-28 LAB — I-STAT BETA HCG BLOOD, ED (MC, WL, AP ONLY): I-stat hCG, quantitative: <5 m[IU]/mL (ref <5)

## 2021-03-28 NOTE — ED Provider Notes (Signed)
Emergency Medicine Provider Triage Evaluation Note  Dana Snyder , a 30 y.o. female  was evaluated in triage.  Pt complains of who presents to the emergency department with a headache.  She states her symptoms started last night.  Reports associated photophobia, nausea.  Also states she has pain in the back of her head that is radiating "all the way down her spine".  She states she has a history of migraines that typically resolve with Excedrin.  She has taken Excedrin with no relief.  She states "this headache is 20 times worse than any headache that I have ever had".  Physical Exam  BP 117/86 (BP Location: Left Arm)   Pulse 98   Temp 99.3 F (37.4 C) (Oral)   Resp 18   SpO2 95%  Gen:   Awake, tearful Resp:  Normal effort  MSK:   Moves extremities without difficulty  Other:    Medical Decision Making  Medically screening exam initiated at 11:28 PM.  Appropriate orders placed.  Yolando Gillum was informed that the remainder of the evaluation will be completed by another provider, this initial triage assessment does not replace that evaluation, and the importance of remaining in the ED until their evaluation is complete.   Placido Sou, PA-C 03/28/21 2329    Cathren Laine, MD 03/29/21 1650

## 2021-03-28 NOTE — ED Triage Notes (Signed)
Pt c/o migraine starting last night, reports hx migraines but states this is worse than usual. C/o light sensitivity and nausea

## 2021-03-29 LAB — COMPREHENSIVE METABOLIC PANEL
ALT: 18 U/L (ref 0–44)
AST: 14 U/L — ABNORMAL LOW (ref 15–41)
Albumin: 4 g/dL (ref 3.5–5.0)
Alkaline Phosphatase: 36 U/L — ABNORMAL LOW (ref 38–126)
Anion gap: 5 (ref 5–15)
BUN: 14 mg/dL (ref 6–20)
CO2: 28 mmol/L (ref 22–32)
Calcium: 9.1 mg/dL (ref 8.9–10.3)
Chloride: 108 mmol/L (ref 98–111)
Creatinine, Ser: 1 mg/dL (ref 0.44–1.00)
GFR, Estimated: >60 mL/min (ref >60)
Glucose, Bld: 133 mg/dL — ABNORMAL HIGH (ref 70–99)
Potassium: 4.1 mmol/L (ref 3.5–5.1)
Sodium: 141 mmol/L (ref 135–145)
Total Bilirubin: 0.4 mg/dL (ref 0.3–1.2)
Total Protein: 7.2 g/dL (ref 6.5–8.1)

## 2021-03-29 MED ORDER — SUMATRIPTAN SUCCINATE 100 MG PO TABS: ORAL_TABLET | ORAL | 0 refills | Status: DC

## 2021-03-29 MED ORDER — DEXAMETHASONE SODIUM PHOSPHATE 10 MG/ML IJ SOLN
10.0000 mg | Freq: Once | INTRAMUSCULAR | Status: AC
  Administered 2021-03-29: 01:00:00 10 mg via INTRAVENOUS
  Filled 2021-03-29: qty 1

## 2021-03-29 MED ORDER — METOCLOPRAMIDE HCL 5 MG/ML IJ SOLN
10.0000 mg | Freq: Once | INTRAMUSCULAR | Status: AC
Start: 2021-03-29 — End: 2021-03-29
  Administered 2021-03-29: 01:00:00 10 mg via INTRAVENOUS
  Filled 2021-03-29: qty 2

## 2021-03-29 MED ORDER — DIPHENHYDRAMINE HCL 50 MG/ML IJ SOLN
12.5000 mg | Freq: Once | INTRAMUSCULAR | Status: AC
  Administered 2021-03-29: 01:00:00 12.5 mg via INTRAVENOUS
  Filled 2021-03-29: qty 1

## 2021-03-29 NOTE — ED Provider Notes (Signed)
[facility]   CSN: [id number] Arrival date & time: 03/28/21  2224     History Chief Complaint  Patient presents with   Migraine    Dana Snyder is a 30 y.o. female.  Patient to ED for treatment of global headache that started 24 hours ago and is c/w history of migraine headaches. She took Exedrin Migraine without the usual relief. She reports photophobia, nausea without vomiting. No fever. No SOB, CP.   The history is provided by the patient. No language interpreter was used.  Migraine Associated symptoms include headaches. Pertinent negatives include no chest pain, no abdominal pain and no shortness of breath.      Past Medical History:  Diagnosis Date   Encounter for trial of labor 05/13/2016   History of shingles    Kidney infection    Migraines    Vaginal Pap smear, abnormal    VBAC, delivered, current hospitalization 05/14/2016    Patient Active Problem List   Diagnosis Date Noted   VBAC, delivered, current hospitalization 05/14/2016   Normal pregnancy in multigravida in third trimester 05/13/2016   Encounter for trial of labor 05/13/2016   ASCUS with positive high risk HPV cervical 08/28/2015    Past Surgical History:  Procedure Laterality Date   CESAREAN SECTION  2011   KNEE SURGERY  2009   WRIST SURGERY  2006   cyst removed     OB History     Gravida  4   Para  3   Term  3   Preterm      AB  1   Living  3      SAB      IAB      Ectopic      Multiple  0   Live Births  1           Family History  Problem Relation Age of Onset   Hypertension Mother    Diabetes Father    Hypertension Father    Stroke Paternal Grandmother     Social History   Tobacco Use   Smoking status: Former    Pack years: 0.00   Smokeless tobacco: Never  Substance Use Topics   Alcohol use: Yes    Comment: Occasional prior to pregnancy   Drug use: No    Home Medications Prior to Admission  medications   Medication Sig Start Date End Date Taking? Authorizing Provider  acetaminophen (TYLENOL) 325 MG tablet Take 650 mg by mouth every 6 (six) hours as needed for moderate pain.    [provider]  aspirin-acetaminophen-caffeine (EXCEDRIN MIGRAINE) [phone] MG tablet Take 1 tablet by mouth every 6 (six) hours as needed for headache.    [provider]  cephALEXin (KEFLEX) 500 MG capsule Take 1 capsule (500 mg total) by mouth 3 (three) times daily. 03/09/19   Horton, Mayer Maskerourtney F, MD  ibuprofen (ADVIL,MOTRIN) 600 MG tablet Take 1 tablet (600 mg total) by mouth every 6 (six) hours. Patient not taking: Reported on 03/09/2019 05/16/16   Meisinger, Tawanna Coolerodd, MD  levonorgestrel (MIRENA, 52 MG,) 20 MCG/24HR IUD 1 each by Intrauterine route once.  06/04/16   [provider]  naproxen (NAPROSYN) 500 MG tablet Take 1 tablet (500 mg total) by mouth 2 (two) times daily. 03/09/19   Horton, Mayer Maskerourtney F, MD  oxyCODONE (OXY IR/ROXICODONE) 5 MG immediate release tablet Take 1 tablet (5 mg total) by mouth every 4 (four) hours as needed (pain scale  4-7). Patient not taking: Reported on 03/09/2019 05/16/16   Lavina Hamman, MD    Allergies    Darvocet [propoxyphene n-acetaminophen]  Review of Systems   Review of Systems  Constitutional:  Negative for chills and fever.  HENT: Negative.    Eyes:  Positive for photophobia.  Respiratory: Negative.  Negative for shortness of breath.   Cardiovascular: Negative.  Negative for chest pain.  Gastrointestinal:  Positive for nausea. Negative for abdominal pain and vomiting.  Musculoskeletal: Negative.  Negative for myalgias and neck stiffness.  Skin: Negative.   Neurological:  Positive for headaches.  Psychiatric/Behavioral:  Negative for confusion.    Physical Exam Updated Vital Signs BP 113/68 (BP Location: Right Arm)   Pulse 83   Temp 99.3 F (37.4 C) (Oral)   Resp 16   Ht 5\' 4"  (1.626 m)   Wt 90.7 kg   LMP 03/14/2021 (Exact Date)    SpO2 95%   BMI 34.33 kg/m   Physical Exam Vitals and nursing note reviewed.  Constitutional:      Appearance: She is well-developed.  HENT:     Head: Normocephalic and atraumatic.  Eyes:     Pupils: Pupils are equal, round, and reactive to light.  Cardiovascular:     Rate and Rhythm: Normal rate and regular rhythm.     Heart sounds: No murmur heard. Pulmonary:     Effort: Pulmonary effort is normal.     Breath sounds: Normal breath sounds. No wheezing, rhonchi or rales.  Abdominal:     General: Bowel sounds are normal.     Palpations: Abdomen is soft.     Tenderness: There is no abdominal tenderness. There is no guarding or rebound.  Musculoskeletal:        General: Normal range of motion.     Cervical back: Normal range of motion and neck supple.  Skin:    General: Skin is warm and dry.  Neurological:     General: No focal deficit present.     Mental Status: She is alert and oriented to person, place, and time.     Comments: CN's 3-12 grossly intact. Speech is clear and focused. No facial asymmetry. No lateralizing weakness. Reflexes are equal. No deficits of coordination. Ambulatory without imbalance.      ED Results / Procedures / Treatments   Labs (all labs ordered are listed, but only abnormal results are displayed) Labs Reviewed  COMPREHENSIVE METABOLIC PANEL - Abnormal; Notable for the following components:      Result Value   Glucose, Bld 133 (*)    AST 14 (*)    Alkaline Phosphatase 36 (*)    All other components within normal limits  CBC WITH DIFFERENTIAL/PLATELET  I-STAT BETA HCG BLOOD, ED ([facility], [facility], [facility] ONLY)   Results for orders placed or performed during the hospital encounter of 03/28/21  Comprehensive metabolic panel  Result Value Ref Range   Sodium 141 135 - 145 mmol/L   Potassium 4.1 3.5 - 5.1 mmol/L   Chloride 108 98 - 111 mmol/L   CO2 28 22 - 32 mmol/L   Glucose, Bld 133 (H) 70 - 99 mg/dL   BUN 14 6 - 20 mg/dL   Creatinine, Ser 03/30/21 0.44 - 1.00  mg/dL   Calcium 9.1 8.9 - 0.97 mg/dL   Total Protein 7.2 6.5 - 8.1 g/dL   Albumin 4.0 3.5 - 5.0 g/dL   AST 14 (L) 15 - 41 U/L   ALT 18 0 - 44 U/L  Alkaline Phosphatase 36 (L) 38 - 126 U/L   Total Bilirubin 0.4 0.3 - 1.2 mg/dL   GFR, Estimated >03 >55 mL/min   Anion gap 5 5 - 15  CBC with Differential  Result Value Ref Range   WBC 9.2 4.0 - 10.5 K/uL   RBC 4.35 3.87 - 5.11 MIL/uL   Hemoglobin 13.4 12.0 - 15.0 g/dL   HCT 97.4 16.3 - 84.5 %   MCV 93.3 80.0 - 100.0 fL   MCH 30.8 26.0 - 34.0 pg   MCHC 33.0 30.0 - 36.0 g/dL   RDW 36.4 68.0 - 32.1 %   Platelets 244 150 - 400 K/uL   nRBC 0.0 0.0 - 0.2 %   Neutrophils Relative % 80 %   Neutro Abs 7.3 1.7 - 7.7 K/uL   Lymphocytes Relative 14 %   Lymphs Abs 1.3 0.7 - 4.0 K/uL   Monocytes Relative 5 %   Monocytes Absolute 0.5 0.1 - 1.0 K/uL   Eosinophils Relative 1 %   Eosinophils Absolute 0.1 0.0 - 0.5 K/uL   Basophils Relative 0 %   Basophils Absolute 0.0 0.0 - 0.1 K/uL   Immature Granulocytes 0 %   Abs Immature Granulocytes 0.03 0.00 - 0.07 K/uL  I-Stat beta hCG blood, ED  Result Value Ref Range   I-stat hCG, quantitative <5.0 <5 mIU/mL   Comment 3             EKG None  Radiology CT Head Wo Contrast  Result Date: 03/29/2021 CLINICAL DATA:  Headaches for 1 day. Nausea and photophobia. History of migraines. EXAM: CT HEAD WITHOUT CONTRAST TECHNIQUE: Contiguous axial images were obtained from the base of the skull through the vertex without intravenous contrast. COMPARISON:  10/20/2011 FINDINGS: Brain: There is mild ventricular dilatation, similar to prior study. No mass-effect or midline shift. No abnormal extra-axial fluid collections. The gray-white matter junctions are distinct. Basal cisterns are not effaced. No acute intracranial hemorrhage. Vascular: No hyperdense vessel or unexpected calcification. Skull: Calvarium appears intact. Sinuses/Orbits: Paranasal sinuses and mastoid air cells are clear. Other: No significant  change since previous study. IMPRESSION: Mild chronic ventricular dilatation of nonspecific etiology. No acute intracranial abnormalities are demonstrated. Electronically Signed   By: Burman Nieves M.D.   On: 03/29/2021 00:09    Procedures Procedures   Medications Ordered in ED Medications  metoCLOPramide (REGLAN) injection 10 mg (has no administration in time range)  diphenhydrAMINE (BENADRYL) injection 12.5 mg (has no administration in time range)  dexamethasone (DECADRON) injection 10 mg (has no administration in time range)    ED Course  I have reviewed the triage vital signs and the nursing notes.  Pertinent labs & imaging results that were available during my care of the patient were reviewed by me and considered in my medical decision making (see chart for details).    MDM Rules/Calculators/A&P                          Patient to ED with headache typical for her migraine history.   Normal neuro exam. VSS. Patient received migraine cocktail with Reglan, Benadryl, Decadron and reports resolution of pain. She has not been on Imitrex in the past. Will Rx this and give instructions on use. She has not seen a neurologist for recurrent headaches. Will provide referral.   Final Clinical Impression(s) / ED Diagnoses Final diagnoses:  None   Migraine headache  Rx / DC Orders ED Discharge Orders  None        Elpidio Anis, PA-C 03/29/21 9242    Geoffery Lyons, MD 03/29/21 [phone]

## 2021-03-29 NOTE — Discharge Instructions (Addendum)
Take Imitrex if and when you have a recurrent headache. Follow directions for use.   Follow up with [facility] for migraine management.   Return to the ED with any new or concerning symptoms.

## 2021-03-30 ENCOUNTER — Other Ambulatory Visit: Payer: Self-pay

## 2021-03-30 ENCOUNTER — Emergency Department (HOSPITAL_BASED_OUTPATIENT_CLINIC_OR_DEPARTMENT_OTHER): Payer: Self-pay

## 2021-03-30 ENCOUNTER — Emergency Department (HOSPITAL_BASED_OUTPATIENT_CLINIC_OR_DEPARTMENT_OTHER)
Admission: EM | Admit: 2021-03-30 | Discharge: 2021-03-30 | Disposition: A | Discharge status: Home / Self Care | Payer: Self-pay | Attending: Emergency Medicine | Admitting: Emergency Medicine

## 2021-03-30 ENCOUNTER — Encounter (HOSPITAL_BASED_OUTPATIENT_CLINIC_OR_DEPARTMENT_OTHER): Payer: Self-pay | Admitting: Emergency Medicine

## 2021-03-30 DIAGNOSIS — R519 Headache, unspecified: Secondary | ICD-10-CM | POA: Insufficient documentation

## 2021-03-30 DIAGNOSIS — Z87891 Personal history of nicotine dependence: Secondary | ICD-10-CM | POA: Insufficient documentation

## 2021-03-30 DIAGNOSIS — M542 Cervicalgia: Secondary | ICD-10-CM | POA: Insufficient documentation

## 2021-03-30 DIAGNOSIS — Z8669 Personal history of other diseases of the nervous system and sense organs: Secondary | ICD-10-CM | POA: Insufficient documentation

## 2021-03-30 MED ORDER — SODIUM CHLORIDE 0.9 % IV BOLUS
1000.0000 mL | Freq: Once | INTRAVENOUS | Status: AC
  Administered 2021-03-30: 20:00:00 1000 mL via INTRAVENOUS

## 2021-03-30 MED ORDER — MAGNESIUM SULFATE 2 GM/50ML IV SOLN
2.0000 g | Freq: Once | INTRAVENOUS | Status: AC
  Administered 2021-03-30: 20:00:00 2 g via INTRAVENOUS
  Filled 2021-03-30: qty 50

## 2021-03-30 MED ORDER — IOHEXOL 350 MG/ML SOLN
75.0000 mL | Freq: Once | INTRAVENOUS | Status: AC | PRN
  Administered 2021-03-30: 75 mL via INTRAVENOUS

## 2021-03-30 MED ORDER — HALOPERIDOL LACTATE 5 MG/ML IJ SOLN
4.0000 mg | Freq: Once | INTRAMUSCULAR | Status: AC
  Administered 2021-03-30: 20:00:00 4 mg via INTRAVENOUS
  Filled 2021-03-30: qty 1

## 2021-03-30 NOTE — ED Notes (Signed)
Pt taken off of O2 d/t pt stating it has helped with her HA.

## 2021-03-30 NOTE — ED Notes (Signed)
RT placed pt on [place] 4LPM for migraine pain. Pt tolerating well.Pt respiratory status stable w/no distress noted. RT will continue to monitor.

## 2021-03-30 NOTE — ED Provider Notes (Signed)
[facility] EMERGENCY DEPT Provider Note   CSN: [id number] Arrival date & time: 03/30/21  1723     History Chief Complaint  Patient presents with   Migraine    Dana Snyder is a 30 y.o. female.  HPI     Constant pressure in head, has episodes of sharp stabbing pain behind temples and eyes, top of neck down spine has pressure and pain, hurts to walk, step down, step up.  This headache began Tuesday, hx of headaches and usually feels better after excedrin migraine but this headache is different. Started slowly and got worse, increasing since then, 10/10 or over 10 pain.   Nothing makes the pain better.  Light makes it worse, any movement, getting up or down. Worse with turning head and neck pain.  Nausea, no vomiting. No fever but feels like has one.  Feels hot but hasnt had fever  Denies numbness, weakness, difficulty talking or walking, visual changes or facial droop.   No cough, congestion, sore throat.    Past Medical History:  Diagnosis Date   Encounter for trial of labor 05/13/2016   History of shingles    Kidney infection    Migraines    Vaginal Pap smear, abnormal    VBAC, delivered, current hospitalization 05/14/2016    Patient Active Problem List   Diagnosis Date Noted   VBAC, delivered, current hospitalization 05/14/2016   Normal pregnancy in multigravida in third trimester 05/13/2016   Encounter for trial of labor 05/13/2016   ASCUS with positive high risk HPV cervical 08/28/2015    Past Surgical History:  Procedure Laterality Date   CESAREAN SECTION  2011   KNEE SURGERY  2009   WRIST SURGERY  2006   cyst removed     OB History     Gravida  4   Para  3   Term  3   Preterm      AB  1   Living  3      SAB      IAB      Ectopic      Multiple  0   Live Births  1           Family History  Problem Relation Age of Onset   Hypertension Mother    Diabetes Father    Hypertension Father    Stroke Paternal Grandmother      Social History   Tobacco Use   Smoking status: Former    Pack years: 0.00   Smokeless tobacco: Never  Substance Use Topics   Alcohol use: Yes    Comment: Occasional prior to pregnancy   Drug use: No    Home Medications Prior to Admission medications   Medication Sig Start Date End Date Taking? Authorizing Provider  acetaminophen (TYLENOL) 325 MG tablet Take 650 mg by mouth every 6 (six) hours as needed for moderate pain.    [provider]  aspirin-acetaminophen-caffeine (EXCEDRIN MIGRAINE) [id number] MG tablet Take 1 tablet by mouth every 6 (six) hours as needed for headache.    [provider]  ibuprofen (ADVIL,MOTRIN) 600 MG tablet Take 1 tablet (600 mg total) by mouth every 6 (six) hours. Patient not taking: Reported on 03/09/2019 05/16/16   Lavina Hamman, MD  levonorgestrel (MIRENA) 20 MCG/24HR IUD 1 each by Intrauterine route once.  06/04/16   [provider]  SUMAtriptan (IMITREX) 100 MG tablet Take one tablet at the onset of a migraine headache. May repeat in 2 hours if  headache persists or recurs for one dose. Maximum 2 doses in 24 hours. 03/29/21   Elpidio Anis, PA-C    Allergies    Darvocet [propoxyphene n-acetaminophen]  Review of Systems   Review of Systems  Constitutional:  Negative for fever.  HENT:  Negative for sore throat.   Eyes:  Negative for visual disturbance.  Respiratory:  Negative for cough and shortness of breath.   Cardiovascular:  Negative for chest pain.  Gastrointestinal:  Positive for nausea. Negative for abdominal pain.  Genitourinary:  Negative for difficulty urinating.  Musculoskeletal:  Positive for back pain and neck pain.  Skin:  Negative for rash.  Neurological:  Positive for light-headedness and headaches. Negative for syncope, facial asymmetry, weakness and numbness.   Physical Exam Updated Vital Signs BP 101/76 (BP Location: Left Arm)   Pulse 77   Temp 98.7 F (37.1 C) (Oral)   Resp 16   LMP  03/14/2021 (Exact Date)   SpO2 100%   Physical Exam Vitals and nursing note reviewed.  Constitutional:      General: She is in acute distress (tearful).     Appearance: Normal appearance. She is well-developed. She is not ill-appearing or diaphoretic.  HENT:     Head: Normocephalic and atraumatic.  Eyes:     General: No visual field deficit.    Extraocular Movements: Extraocular movements intact.     Conjunctiva/sclera: Conjunctivae normal.     Pupils: Pupils are equal, round, and reactive to light.  Cardiovascular:     Rate and Rhythm: Normal rate and regular rhythm.     Pulses: Normal pulses.     Heart sounds: Normal heart sounds. No murmur heard.   No friction rub. No gallop.  Pulmonary:     Effort: Pulmonary effort is normal. No respiratory distress.     Breath sounds: Normal breath sounds. No wheezing or rales.  Abdominal:     General: There is no distension.     Palpations: Abdomen is soft.     Tenderness: There is no abdominal tenderness. There is no guarding.  Musculoskeletal:        General: No swelling or tenderness.     Cervical back: Normal range of motion.  Skin:    General: Skin is warm and dry.     Findings: No erythema or rash.  Neurological:     General: No focal deficit present.     Mental Status: She is alert and oriented to person, place, and time.     GCS: GCS eye subscore is 4. GCS verbal subscore is 5. GCS motor subscore is 6.     Cranial Nerves: No cranial nerve deficit, dysarthria or facial asymmetry.     Sensory: No sensory deficit.     Motor: No weakness or tremor.     Coordination: Coordination normal. Finger-Nose-Finger Test normal.     Gait: Gait normal.    ED Results / Procedures / Treatments   Labs (all labs ordered are listed, but only abnormal results are displayed) Labs Reviewed - No data to display  EKG None  Radiology CT Angio Head W or Wo Contrast  Result Date: 03/30/2021 CLINICAL DATA:  Migraine with acute neurologic  deficit EXAM: CT ANGIOGRAPHY HEAD AND NECK TECHNIQUE: Multidetector CT imaging of the head and neck was performed using the standard protocol during bolus administration of intravenous contrast. Multiplanar CT image reconstructions and MIPs were obtained to evaluate the vascular anatomy. Carotid stenosis measurements (when applicable) are obtained utilizing NASCET criteria, using the  distal internal carotid diameter as the denominator. CONTRAST:  75mL OMNIPAQUE IOHEXOL 350 MG/ML SOLN COMPARISON:  None. FINDINGS: CT HEAD FINDINGS Brain: There is no mass, hemorrhage or extra-axial collection. The size and configuration of the ventricles and extra-axial CSF spaces are normal. There is no acute or chronic infarction. The brain parenchyma is normal. Skull: The visualized skull base, calvarium and extracranial soft tissues are normal. Sinuses/Orbits: No fluid levels or advanced mucosal thickening of the visualized paranasal sinuses. No mastoid or middle ear effusion. The orbits are normal. CTA NECK FINDINGS SKELETON: There is no bony spinal canal stenosis. No lytic or blastic lesion. OTHER NECK: Normal pharynx, larynx and major salivary glands. No cervical lymphadenopathy. Unremarkable thyroid gland. UPPER CHEST: No pneumothorax or pleural effusion. No nodules or masses. AORTIC ARCH: There is no calcific atherosclerosis of the aortic arch. There is no aneurysm, dissection or hemodynamically significant stenosis of the visualized portion of the aorta. Conventional 3 vessel aortic branching pattern. The visualized proximal subclavian arteries are widely patent. RIGHT CAROTID SYSTEM: Normal without aneurysm, dissection or stenosis. LEFT CAROTID SYSTEM: Normal without aneurysm, dissection or stenosis. VERTEBRAL ARTERIES: Left dominant configuration. Both origins are clearly patent. There is no dissection, occlusion or flow-limiting stenosis to the skull base (V1-V3 segments). CTA HEAD FINDINGS POSTERIOR CIRCULATION:  --Vertebral arteries: Normal V4 segments. --Inferior cerebellar arteries: Normal. --Basilar artery: Normal. --Superior cerebellar arteries: Normal. --Posterior cerebral arteries (PCA): Normal. ANTERIOR CIRCULATION: --Intracranial internal carotid arteries: Normal. --Anterior cerebral arteries (ACA): Normal. Both A1 segments are present. Patent anterior communicating artery (a-comm). --Middle cerebral arteries (MCA): Normal. VENOUS SINUSES: As permitted by contrast timing, patent. ANATOMIC VARIANTS: None Review of the MIP images confirms the above findings. IMPRESSION: Normal CTA of the head and neck. Electronically Signed   By: Deatra RobinsonKevin  Herman M.D.   On: 03/30/2021 21:00   CT Angio Neck W and/or Wo Contrast  Result Date: 03/30/2021 CLINICAL DATA:  Migraine with acute neurologic deficit EXAM: CT ANGIOGRAPHY HEAD AND NECK TECHNIQUE: Multidetector CT imaging of the head and neck was performed using the standard protocol during bolus administration of intravenous contrast. Multiplanar CT image reconstructions and MIPs were obtained to evaluate the vascular anatomy. Carotid stenosis measurements (when applicable) are obtained utilizing NASCET criteria, using the distal internal carotid diameter as the denominator. CONTRAST:  75mL OMNIPAQUE IOHEXOL 350 MG/ML SOLN COMPARISON:  None. FINDINGS: CT HEAD FINDINGS Brain: There is no mass, hemorrhage or extra-axial collection. The size and configuration of the ventricles and extra-axial CSF spaces are normal. There is no acute or chronic infarction. The brain parenchyma is normal. Skull: The visualized skull base, calvarium and extracranial soft tissues are normal. Sinuses/Orbits: No fluid levels or advanced mucosal thickening of the visualized paranasal sinuses. No mastoid or middle ear effusion. The orbits are normal. CTA NECK FINDINGS SKELETON: There is no bony spinal canal stenosis. No lytic or blastic lesion. OTHER NECK: Normal pharynx, larynx and major salivary glands.  No cervical lymphadenopathy. Unremarkable thyroid gland. UPPER CHEST: No pneumothorax or pleural effusion. No nodules or masses. AORTIC ARCH: There is no calcific atherosclerosis of the aortic arch. There is no aneurysm, dissection or hemodynamically significant stenosis of the visualized portion of the aorta. Conventional 3 vessel aortic branching pattern. The visualized proximal subclavian arteries are widely patent. RIGHT CAROTID SYSTEM: Normal without aneurysm, dissection or stenosis. LEFT CAROTID SYSTEM: Normal without aneurysm, dissection or stenosis. VERTEBRAL ARTERIES: Left dominant configuration. Both origins are clearly patent. There is no dissection, occlusion or flow-limiting stenosis to the skull base (  V1-V3 segments). CTA HEAD FINDINGS POSTERIOR CIRCULATION: --Vertebral arteries: Normal V4 segments. --Inferior cerebellar arteries: Normal. --Basilar artery: Normal. --Superior cerebellar arteries: Normal. --Posterior cerebral arteries (PCA): Normal. ANTERIOR CIRCULATION: --Intracranial internal carotid arteries: Normal. --Anterior cerebral arteries (ACA): Normal. Both A1 segments are present. Patent anterior communicating artery (a-comm). --Middle cerebral arteries (MCA): Normal. VENOUS SINUSES: As permitted by contrast timing, patent. ANATOMIC VARIANTS: None Review of the MIP images confirms the above findings. IMPRESSION: Normal CTA of the head and neck. Electronically Signed   By: Deatra Robinson M.D.   On: 03/30/2021 21:00   CT C-SPINE NO CHARGE  Result Date: 03/30/2021 CLINICAL DATA:  Left-sided headache EXAM: CT CERVICAL SPINE WITHOUT CONTRAST TECHNIQUE: Multidetector CT imaging of the cervical spine was performed without intravenous contrast. Multiplanar CT image reconstructions were also generated. COMPARISON:  None. FINDINGS: Alignment: No static subluxation. Facets are aligned. Occipital condyles and the lateral masses of C1 and C2 are normally approximated. Skull base and vertebrae: No acute  fracture. Soft tissues and spinal canal: No prevertebral fluid or swelling. No visible canal hematoma. Disc levels: No advanced spinal canal or neural foraminal stenosis. Upper chest: No pneumothorax, pulmonary nodule or pleural effusion. Other: Normal visualized paraspinal cervical soft tissues. IMPRESSION: No acute fracture or static subluxation of the cervical spine. Electronically Signed   By: Deatra Robinson M.D.   On: 03/30/2021 21:06    Procedures Procedures   Medications Ordered in ED Medications  sodium chloride 0.9 % bolus 1,000 mL (0 mLs Intravenous Stopped 03/30/21 2101)  haloperidol lactate (HALDOL) injection 4 mg (4 mg Intravenous Given 03/30/21 1944)  magnesium sulfate IVPB 2 g 50 mL (0 g Intravenous Stopped 03/30/21 2101)  iohexol (OMNIPAQUE) 350 MG/ML injection 75 mL (75 mLs Intravenous Contrast Given 03/30/21 2016)    ED Course  I have reviewed the triage vital signs and the nursing notes.  Pertinent labs & imaging results that were available during my care of the patient were reviewed by me and considered in my medical decision making (see chart for details).    MDM Rules/Calculators/A&P                          30yo female with history of migraines, presents with concern for headache for 4 days.  Seen and had labs, CT completed 2 days-yesterday at [facility] however reports headache has not improved.   CTA completed to evaluate for signs of aneurysm or other abnormalities in setting of severe pain.  CT CSpine also ordered to evaluate for abnormalities given severe pain.   CT show no acute findings.  Given slow onset of headache and migrainous components with photophobia have low suspicion for occult SAH and suspect likely severe migraine.  Dural venous sinus thrombosis seems less likely. No findings to suggest glaucoma. No fever and 4 days of symptoms and doubt meningitis, RMSF. Declines covid swab  Improved with Mg and haldol.  Was given sumatriptan rx previously and recommend  Neurology follow up.Patient discharged in stable condition with understanding of reasons to return.    Final Clinical Impression(s) / ED Diagnoses Final diagnoses:  Neck pain  Acute nonintractable headache, unspecified headache type    Rx / DC Orders ED Discharge Orders     None        Alvira Monday, MD 03/31/21 1045

## 2021-03-30 NOTE — ED Triage Notes (Signed)
Pt presents to ED Pov. Pt c/o migraine since Tuesday. Pt reports that pain is in back of head and intermittent stabbing pain to L side. Pt reports that she was given HA cocktail and sumatriptan at [place] and has not helped at all

## 2021-12-28 ENCOUNTER — Emergency Department (HOSPITAL_BASED_OUTPATIENT_CLINIC_OR_DEPARTMENT_OTHER)
Admission: EM | Admit: 2021-12-28 | Discharge: 2021-12-28 | Disposition: A | Discharge status: Home / Self Care | Payer: Medicaid Other | Attending: Emergency Medicine | Admitting: Emergency Medicine

## 2021-12-28 ENCOUNTER — Emergency Department (HOSPITAL_BASED_OUTPATIENT_CLINIC_OR_DEPARTMENT_OTHER): Payer: Medicaid Other

## 2021-12-28 ENCOUNTER — Other Ambulatory Visit: Payer: Self-pay

## 2021-12-28 DIAGNOSIS — S8001XA Contusion of right knee, initial encounter: Secondary | ICD-10-CM | POA: Insufficient documentation

## 2021-12-28 DIAGNOSIS — R590 Localized enlarged lymph nodes: Secondary | ICD-10-CM | POA: Insufficient documentation

## 2021-12-28 DIAGNOSIS — J020 Streptococcal pharyngitis: Secondary | ICD-10-CM | POA: Insufficient documentation

## 2021-12-28 DIAGNOSIS — Z20822 Contact with and (suspected) exposure to covid-19: Secondary | ICD-10-CM | POA: Insufficient documentation

## 2021-12-28 DIAGNOSIS — X58XXXA Exposure to other specified factors, initial encounter: Secondary | ICD-10-CM | POA: Insufficient documentation

## 2021-12-28 DIAGNOSIS — Z7982 Long term (current) use of aspirin: Secondary | ICD-10-CM | POA: Insufficient documentation

## 2021-12-28 LAB — GROUP A STREP BY PCR: Group A Strep by PCR: DETECTED — AB

## 2021-12-28 LAB — RESP PANEL BY RT-PCR (FLU A&B, COVID) ARPGX2
Influenza A by PCR: NEGATIVE
Influenza B by PCR: NEGATIVE
SARS Coronavirus 2 by RT PCR: NEGATIVE

## 2021-12-28 MED ORDER — DEXAMETHASONE 4 MG PO TABS
10.0000 mg | ORAL_TABLET | Freq: Once | ORAL | Status: AC
  Administered 2021-12-28: 10 mg via ORAL
  Filled 2021-12-28: qty 3

## 2021-12-28 MED ORDER — PENICILLIN G BENZATHINE 1200000 UNIT/2ML IM SUSY
1.2000 10*6.[IU] | PREFILLED_SYRINGE | Freq: Once | INTRAMUSCULAR | Status: AC
  Administered 2021-12-28: 1.2 10*6.[IU] via INTRAMUSCULAR
  Filled 2021-12-28: qty 2

## 2021-12-28 NOTE — ED Triage Notes (Signed)
Pt to ED via POV c/o flu like symptoms starting last night, sore throat, fever, temp 101-Tylenol taken at 630 this morning. Known sick contact- daughter. Denies pain. No other OTC medications taken . Reports sister recently dx with strep throat.  ?

## 2021-12-28 NOTE — Discharge Instructions (Signed)
You were treated today for strep throat.  Your x-rays were normal and just a bad bruise.  The antibiotic shot you get today will last for a week and you do not need any further antibiotics.  You can continue to use Tylenol ibuprofen for your knee pain also for the pain in your throat.  You can also try Cepacol which has numbing medication in it and comes as a spray or lozenges that might also help your throat pain. ?

## 2021-12-28 NOTE — ED Provider Notes (Signed)
?[facility] EMER[facility]ENCY DEPT ?Provider Note ? ? ?CSN: [id number] ?Arrival date & time: 12/28/21  1113 ? ?  ? ?History ? ?Chief Complaint  ?Patient presents with  ? URI  ? ? ?Dana Snyder is a 31 y.o. female. ? ?The history is provided by the patient.  ?URI ?Presenting symptoms: fever and sore throat   ?Severity:  Moderate ?Onset quality:  Gradual ?Duration:  1 day ?Timing:  Constant ?Progression:  Worsening ?Chronicity:  New ?Relieved by:  Nothing ?Exacerbated by: Swallowing. ?Ineffective treatments:  None tried ?Associated symptoms: swollen glands   ?Risk factors: sick contacts   ?Risk factors comment:  Sister was recently diagnosed with strep and she was around her a lot the last few days ? ?  ? ?Home Medications ?Prior to Admission medications   ?Medication Sig Start Date End Date Taking? Authorizing Provider  ?acetaminophen (TYLENOL) 325 MG tablet Take 650 mg by mouth every 6 (six) hours as needed for moderate pain.    [provider]  ?aspirin-acetaminophen-caffeine (EXCEDRIN MIGRAINE) [phone] MG tablet Take 1 tablet by mouth every 6 (six) hours as needed for headache.    [provider]  ?ibuprofen (ADVIL,MOTRIN) 600 MG tablet Take 1 tablet (600 mg total) by mouth every 6 (six) hours. ?Patient not taking: Reported on 03/09/2019 05/16/16   Lavina Hamman, MD  ?levonorgestrel (MIRENA) 20 MCG/24HR IUD 1 each by Intrauterine route once.  06/04/16   [provider]  ?SUMAtriptan (IMITREX) 100 MG tablet Take one tablet at the onset of a migraine headache. May repeat in 2 hours if headache persists or recurs for one dose. Maximum 2 doses in 24 hours. 03/29/21   Elpidio Anis, PA-C  ?   ? ?Allergies    ?Darvocet [propoxyphene n-acetaminophen]   ? ?Review of Systems   ?Review of Systems  ?Constitutional:  Positive for fever.  ?HENT:  Positive for sore throat.   ?Musculoskeletal:   ?     Patient reports she fell about a week ago and she has had ongoing pain and discomfort in  her right knee with bruising.  She reports the swelling is improving but she is still having discomfort with walking and bending  ? ?Physical Exam ?Updated Vital Signs ?BP 105/66   Pulse 98   Temp 98.1 ?F (36.7 ?C) (Oral)   Resp 18   Ht 5\' 4"  (1.626 m)   Wt 90.7 kg   LMP 12/24/2021   SpO2 97%   BMI 34.33 kg/m?  ?Physical Exam ?Vitals and nursing note reviewed.  ?Constitutional:   ?   General: She is not in acute distress. ?   Appearance: She is well-developed.  ?HENT:  ?   Head: Normocephalic and atraumatic.  ?   Nose: Nose normal.  ?   Mouth/Throat:  ?   Pharynx: Posterior oropharyngeal erythema present. No oropharyngeal exudate.  ?   Tonsils: Tonsillar exudate present. No tonsillar abscesses.  ?Eyes:  ?   Conjunctiva/sclera: Conjunctivae normal.  ?   Pupils: Pupils are equal, round, and reactive to light.  ?Neck:  ?   Comments: No trismus or stridor ?Cardiovascular:  ?   Rate and Rhythm: Normal rate and regular rhythm.  ?   Heart sounds: No murmur heard. ?Pulmonary:  ?   Effort: Pulmonary effort is normal. No respiratory distress.  ?   Breath sounds: Normal breath sounds. No wheezing or rales.  ?Abdominal:  ?   General: There is no distension.  ?   Palpations: Abdomen is soft.  ?  Tenderness: There is no abdominal tenderness. There is no guarding or rebound.  ?Musculoskeletal:     ?   General: Tenderness present. Normal range of motion.  ?   Cervical back: Normal range of motion and neck supple.  ?   Right knee: Swelling and ecchymosis present. Normal range of motion. Tenderness present over the medial joint line and patellar tendon.  ?Lymphadenopathy:  ?   Cervical: Cervical adenopathy present.  ?Skin: ?   General: Skin is warm and dry.  ?   Findings: No erythema or rash.  ?Neurological:  ?   Mental Status: She is alert and oriented to person, place, and time.  ?Psychiatric:     ?   Behavior: Behavior normal.  ? ? ?ED Results / Procedures / Treatments   ?Labs ?(all labs ordered are listed, but only  abnormal results are displayed) ?Labs Reviewed  ?GROUP A STREP BY PCR  ?RESP PANEL BY RT-PCR (FLU A&B, COVID) [id number]  ? ? ?EKG ?None ? ?Radiology ?DG Knee Complete 4 Views Right ? ?Result Date: 12/28/2021 ?CLINICAL DATA:  Right knee pain after a fall. EXAM: RIGHT KNEE - COMPLETE 4+ VIEW COMPARISON:  None. FINDINGS: No evidence of fracture, dislocation, or joint effusion. No evidence of arthropathy or other focal bone abnormality. Soft tissues are unremarkable. IMPRESSION: Negative. Electronically Signed   By: Kennith Center M.D.   On: 12/28/2021 12:12   ? ?Procedures ?Procedures  ? ? ?Medications Ordered in ED ?Medications  ?penicillin g benzathine (BICILLIN LA) 1200000 UNIT/2ML injection 1.2 Million Units (1.2 Million Units Intramuscular Given 12/28/21 1208)  ?dexamethasone (DECADRON) tablet 10 mg (10 mg Oral Given 12/28/21 1206)  ? ? ?ED Course/ Medical Decision Making/ A&P ?  ?                        ?Medical Decision Making ?Amount and/or Complexity of Data Reviewed ?Radiology: ordered and independent interpretation performed. Decision-making details documented in ED Course. ? ?Risk ?Prescription drug management. ? ? ?Patient is a 31 year old female presenting today with sore throat and 4 out of 5 Centor criteria consistent with strep throat with a sister who recently tested positive in the last few days and she has been spending time with her sister.  Patient is well-appearing and low suspicion for RPA, PTA or epiglottitis.  Patient treated with penicillin and Decadron.  Secondly she is complaining of knee pain that occurred after a fall last week.  She has full range of motion but still has evidence of ecchymosis and some mild swelling.  I independently visualized and interpreted her knee film which was negative.  Findings were discussed with the patient.  She will continue supportive care.  She is stable for discharge at this time. ? ? ? ? ? ? ? ?Final Clinical Impression(s) / ED Diagnoses ?Final diagnoses:   ?Strep pharyngitis  ?Contusion of right knee, initial encounter  ? ? ?Rx / DC Orders ?ED Discharge Orders   ? ? None  ? ?  ? ? ?  ?Gwyneth Sprout, MD ?12/28/21 1224 ? ?

## 2022-04-23 ENCOUNTER — Emergency Department (HOSPITAL_BASED_OUTPATIENT_CLINIC_OR_DEPARTMENT_OTHER)
Admission: EM | Admit: 2022-04-23 | Discharge: 2022-04-23 | Disposition: A | Discharge status: Home / Self Care | Payer: Medicaid Other | Attending: Emergency Medicine | Admitting: Emergency Medicine

## 2022-04-23 ENCOUNTER — Encounter (HOSPITAL_BASED_OUTPATIENT_CLINIC_OR_DEPARTMENT_OTHER): Payer: Self-pay

## 2022-04-23 ENCOUNTER — Other Ambulatory Visit: Payer: Self-pay

## 2022-04-23 DIAGNOSIS — J029 Acute pharyngitis, unspecified: Secondary | ICD-10-CM

## 2022-04-23 DIAGNOSIS — Z79899 Other long term (current) drug therapy: Secondary | ICD-10-CM | POA: Insufficient documentation

## 2022-04-23 DIAGNOSIS — Z7982 Long term (current) use of aspirin: Secondary | ICD-10-CM | POA: Insufficient documentation

## 2022-04-23 DIAGNOSIS — R509 Fever, unspecified: Secondary | ICD-10-CM

## 2022-04-23 DIAGNOSIS — J020 Streptococcal pharyngitis: Secondary | ICD-10-CM

## 2022-04-23 LAB — GROUP A STREP BY PCR: Group A Strep by PCR: DETECTED — AB

## 2022-04-23 MED ORDER — DEXAMETHASONE SODIUM PHOSPHATE 10 MG/ML IJ SOLN
10.0000 mg | Freq: Once | INTRAMUSCULAR | Status: AC
  Administered 2022-04-23: 10 mg via INTRAMUSCULAR

## 2022-04-23 MED ORDER — DEXAMETHASONE SODIUM PHOSPHATE 10 MG/ML IJ SOLN
10.0000 mg | Freq: Once | INTRAMUSCULAR | Status: DC
  Filled 2022-04-23: qty 1

## 2022-04-23 MED ORDER — CEPHALEXIN 500 MG PO CAPS: 500.0000 mg | ORAL_CAPSULE | Freq: Two times a day (BID) | ORAL | 0 refills | Status: AC

## 2022-04-23 MED ORDER — ACETAMINOPHEN 325 MG PO TABS
650.0000 mg | ORAL_TABLET | Freq: Once | ORAL | Status: AC | PRN
  Administered 2022-04-23: 650 mg via ORAL
  Filled 2022-04-23: qty 2

## 2022-04-23 NOTE — Discharge Instructions (Signed)
Your history, exam, work-up today are consistent with recurrent strep throat.  We are giving you a different antibiotic to try treating at this time and we gave you the steroid shot that should last for several days.  Please rest and stay hydrated.  If any symptoms change or worsen acutely, please return to the nearest emergency department.

## 2022-04-23 NOTE — ED Provider Notes (Signed)
[facility] EMERGENCY DEPT Provider Note   CSN: [id number] Arrival date & time: 04/23/22  0931     History  Chief Complaint  Patient presents with   Sore Throat    Dana Snyder is a 31 y.o. female.  The history is provided by the patient and medical records. No language interpreter was used.  Sore Throat This is a recurrent problem. The current episode started yesterday. The problem occurs constantly. The problem has not changed since onset.Pertinent negatives include no chest pain, no abdominal pain, no headaches and no shortness of breath. Nothing aggravates the symptoms. Nothing relieves the symptoms. She has tried nothing for the symptoms. The treatment provided no relief.       Home Medications Prior to Admission medications   Medication Sig Start Date End Date Taking? Authorizing Provider  acetaminophen (TYLENOL) 325 MG tablet Take 650 mg by mouth every 6 (six) hours as needed for moderate pain.    [provider]  aspirin-acetaminophen-caffeine (EXCEDRIN MIGRAINE) [phone] MG tablet Take 1 tablet by mouth every 6 (six) hours as needed for headache.    [provider]  ibuprofen (ADVIL,MOTRIN) 600 MG tablet Take 1 tablet (600 mg total) by mouth every 6 (six) hours. Patient not taking: Reported on 03/09/2019 05/16/16   Lavina Hamman, MD  levonorgestrel (MIRENA) 20 MCG/24HR IUD 1 each by Intrauterine route once.  06/04/16   [provider]  SUMAtriptan (IMITREX) 100 MG tablet Take one tablet at the onset of a migraine headache. May repeat in 2 hours if headache persists or recurs for one dose. Maximum 2 doses in 24 hours. 03/29/21   Elpidio Anis, PA-C      Allergies    Darvocet [propoxyphene n-acetaminophen]    Review of Systems   Review of Systems  Constitutional:  Positive for chills and fever. Negative for diaphoresis and fatigue.  HENT:  Positive for sore throat. Negative for congestion.   Eyes:  Negative for visual  disturbance.  Respiratory:  Negative for cough, chest tightness, shortness of breath and wheezing.   Cardiovascular:  Negative for chest pain.  Gastrointestinal:  Negative for abdominal pain, constipation, diarrhea, nausea and vomiting.  Genitourinary:  Negative for dysuria and flank pain.  Musculoskeletal:  Negative for back pain, neck pain and neck stiffness.  Skin:  Negative for rash and wound.  Neurological:  Negative for weakness, light-headedness, numbness and headaches.  Psychiatric/Behavioral:  Negative for agitation and confusion.   All other systems reviewed and are negative.   Physical Exam Updated Vital Signs BP 115/70 (BP Location: Right Arm)   Pulse (!) 116   Temp (!) 101.5 F (38.6 C)   Resp 16   Ht 5\' 4"  (1.626 m)   Wt 83.9 kg   SpO2 98%   BMI 31.76 kg/m  Physical Exam Vitals and nursing note reviewed.  Constitutional:      General: She is not in acute distress.    Appearance: She is well-developed. She is not ill-appearing or diaphoretic.  HENT:     Head: Normocephalic and atraumatic.     Nose: No congestion or rhinorrhea.     Mouth/Throat:     Mouth: Mucous membranes are moist. No oral lesions.     Pharynx: Uvula midline. Posterior oropharyngeal erythema present. No pharyngeal swelling, oropharyngeal exudate or uvula swelling.  Eyes:     Conjunctiva/sclera: Conjunctivae normal.  Cardiovascular:     Rate and Rhythm: Normal rate and regular rhythm.     Heart sounds: No murmur  heard. Pulmonary:     Effort: Pulmonary effort is normal. No respiratory distress.     Breath sounds: Normal breath sounds. No wheezing, rhonchi or rales.  Chest:     Chest wall: No tenderness.  Abdominal:     Palpations: Abdomen is soft.     Tenderness: There is no abdominal tenderness. There is no guarding.  Musculoskeletal:        General: No swelling.     Cervical back: Normal range of motion and neck supple.  Skin:    General: Skin is warm and dry.     Capillary Refill:  Capillary refill takes less than 2 seconds.  Neurological:     Mental Status: She is alert.  Psychiatric:        Mood and Affect: Mood normal.     ED Results / Procedures / Treatments   Labs (all labs ordered are listed, but only abnormal results are displayed) Labs Reviewed  GROUP A STREP BY PCR - Abnormal; Notable for the following components:      Result Value   Group A Strep by PCR DETECTED (*)    All other components within normal limits    EKG None  Radiology No results found.  Procedures Procedures    Medications Ordered in ED Medications  acetaminophen (TYLENOL) tablet 650 mg (650 mg Oral Given 04/23/22 1014)  dexamethasone (DECADRON) injection 10 mg (10 mg Intramuscular Given 04/23/22 1152)    ED Course/ Medical Decision Making/ A&P                           Medical Decision Making Risk OTC drugs. Prescription drug management.    Dana Snyder is a 31 y.o. female with a past medical history significant for migraines, shingles, and previous strep throat who presents with sore throat and fever.  According to patient, she reports her throat has been sore for the last 2 days similar to when she had strep throat several months ago.  She reports she took the shot antibiotics last time and it improved but over the last few days it has returned.  She reports she is still able to eat and drink but it hurts to do so.  She denies difficulty breathing.  Denies any chest pain, shortness of breath, or productive cough.  She has a dry cough that is mild.  She denies any nausea, vomiting, constipation, diarrhea, or urinary changes.  No rashes.  No other complaints reported.  Reports the discomfort is moderate.  On exam, lungs clear and chest nontender.  No stridor.  Abdomen nontender.  Back nontender.  Oropharyngeal exam shows erythema but no exudates.  There is some swelling but it is symmetric.  Uvula midline.  No evidence of PTA or RPA on exam.  Strep test was positive.   As it was several months ago, I do suspect this is a new infection.  We offered a COVID swab but given the isolated throat symptoms patient does not feel she needs it.  Patient was given a shot of Decadron to help with some of the sore throat and swelling and was given a new prescription for antibiotics.  She wanted to try oral antibiotics as opposed to an injection today.  Patient will follow-up with PCP and understood return precautions.  She had no other questions or concerns and was discharged in good condition after p.o. challenge.         Final Clinical Impression(s) /  ED Diagnoses Final diagnoses:  Strep pharyngitis  Sore throat  Fever, unspecified fever cause    Rx / DC Orders ED Discharge Orders          Ordered    cephALEXin (KEFLEX) 500 MG capsule  2 times daily        04/23/22 1339            Clinical Impression: 1. Strep pharyngitis   2. Sore throat   3. Fever, unspecified fever cause     Disposition: Discharge  Condition: Good  I have discussed the results, Dx and Tx plan with the pt(& family if present). He/she/they expressed understanding and agree(s) with the plan. Discharge instructions discussed at great length. Strict return precautions discussed and pt &/or family have verbalized understanding of the instructions. No further questions at time of discharge.    Discharge Medication List as of 04/23/2022  1:40 PM     START taking these medications   Details  cephALEXin (KEFLEX) 500 MG capsule Take 1 capsule (500 mg total) by mouth 2 (two) times daily for 10 days., Starting Tue 04/23/2022, Until Fri 05/03/2022, Print        Follow Up: [facility] [address] [place] [address] [place] [address] [phone] Schedule an appointment as soon as possible for a visit    [facility] [address] [place] [address] [phone]        Cristy Colmenares, Canary Brim, MD 04/23/22 [phone]

## 2022-04-23 NOTE — ED Triage Notes (Signed)
Pt c/o sore throat and congestion since last night.

## 2022-06-21 IMAGING — CT CT CERVICAL SPINE W/O CM
3 of 4 series · 13 of 33 positions shown, 16 images · IV contrast (APPLIED)
Comparison: None.

CLINICAL DATA: Left-sided headache

EXAM:
CT CERVICAL SPINE WITHOUT CONTRAST
TECHNIQUE: Multidetector CT imaging of the cervical spine was performed without
intravenous contrast. Multiplanar CT image reconstructions were also
generated.

[Series 4: c spine coronal · coronal · 0.30mm/px · 3 of 53 slices shown]
[im 11/53  bone]
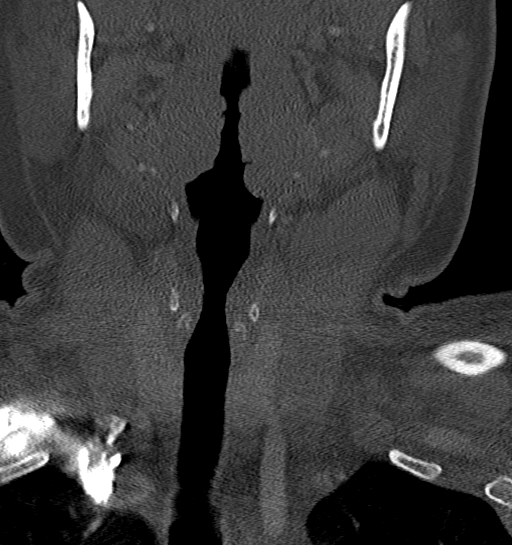
[im 21/53  bone]
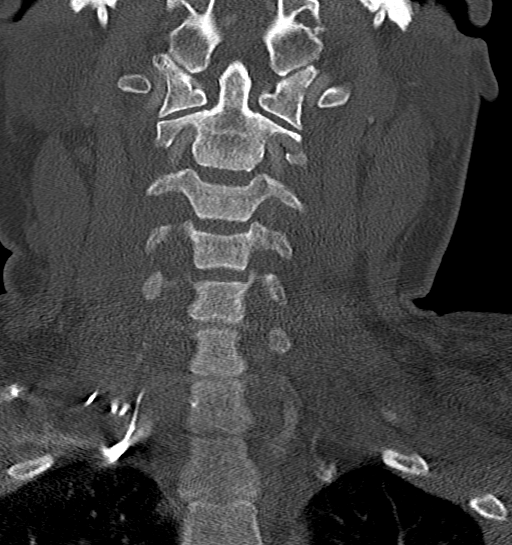
[im 32/53  bone]
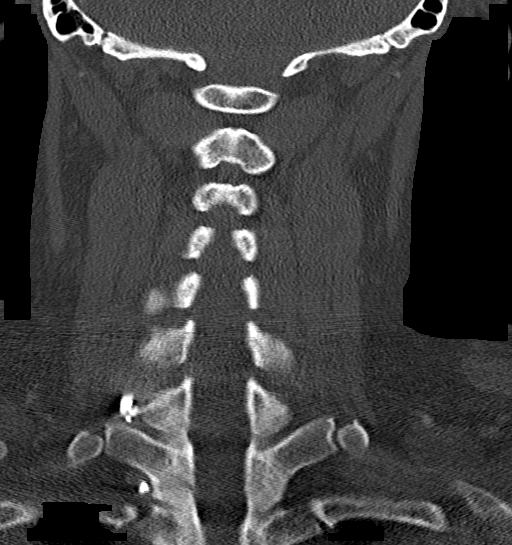

[Series 5: c spine sagittal · sagittal · 0.20mm/px · 5 of 77 slices shown, 6 images]
[im 26/77  bone]
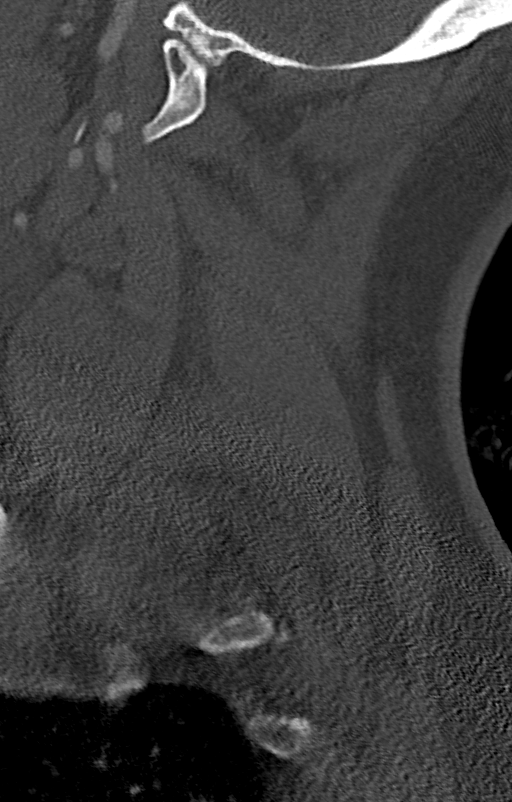
[im 32/77  bone]
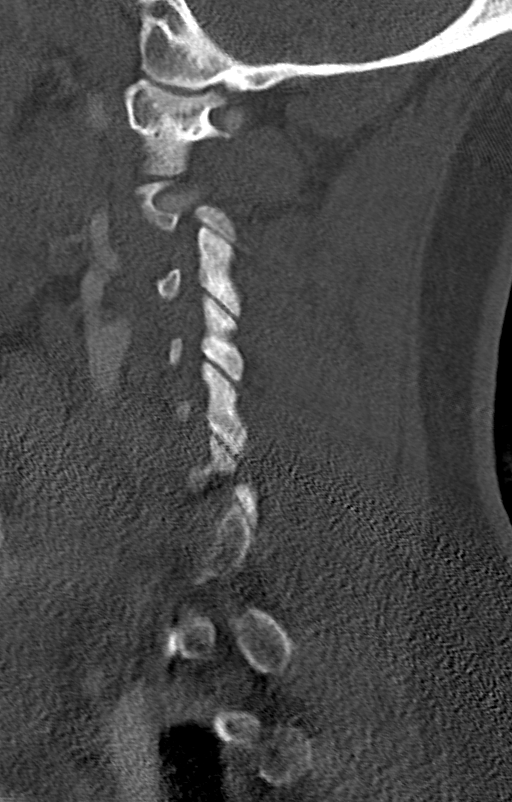
[im 39/77  soft-tissue]
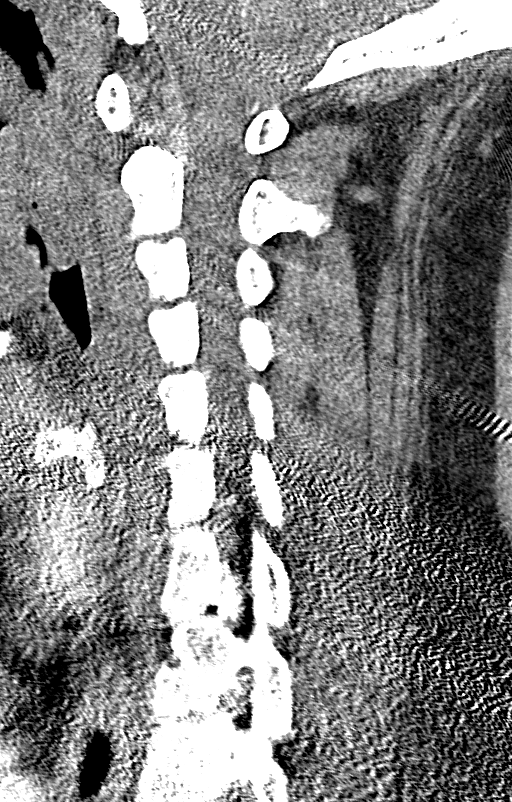
[im 39/77  bone]
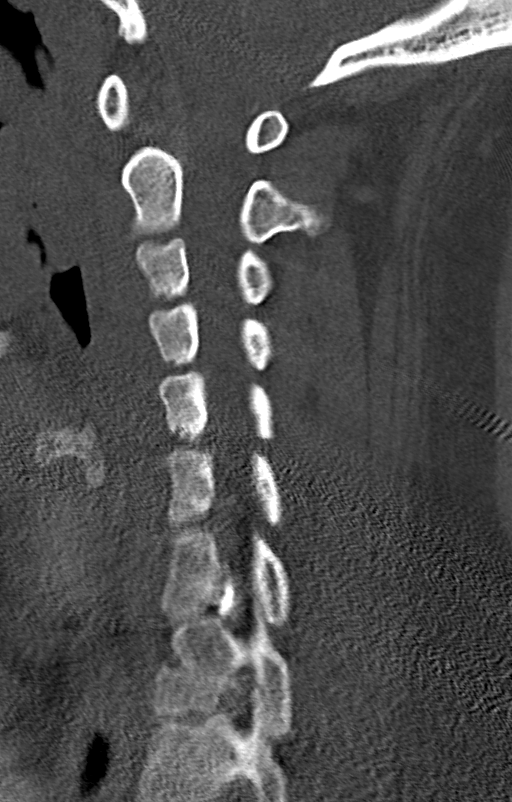
[im 45/77  bone]
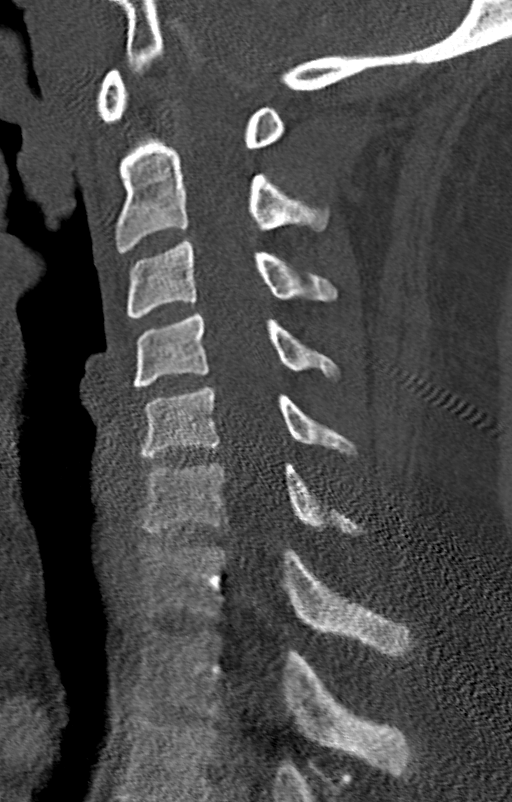
[im 51/77  bone]
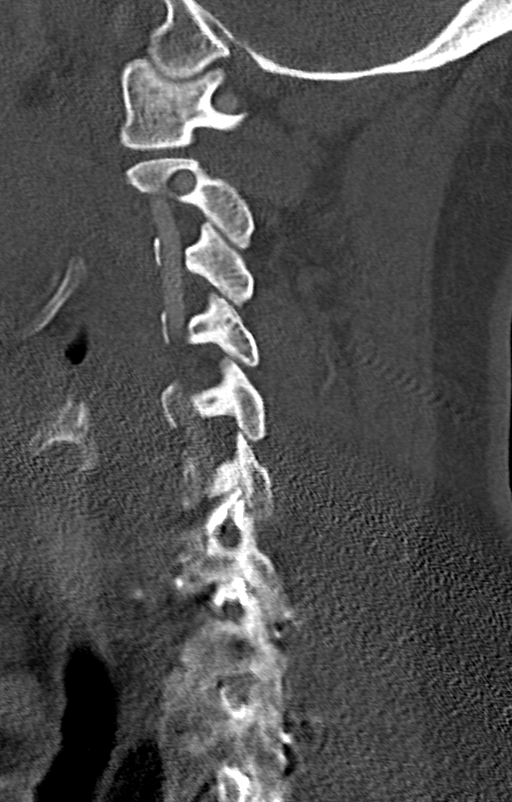

[Series 6: c spine bone · axial · 0.21mm/px · z∈[-289,-189]mm · 5 of 95 slices shown, 7 images]
[im 14/95  soft-tissue]
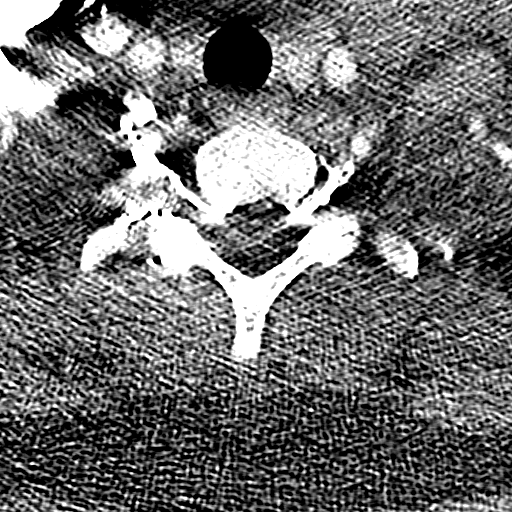
[im 14/95  bone]
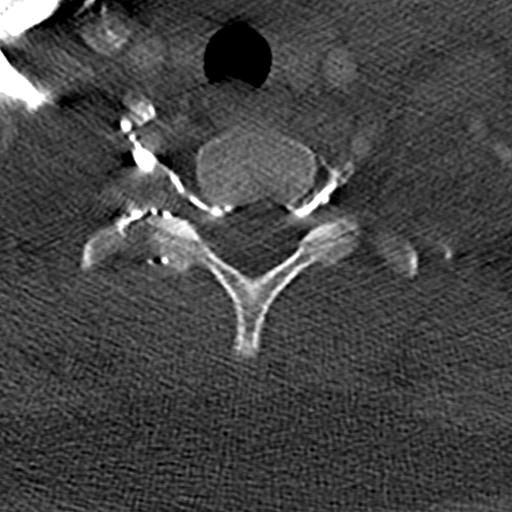
[im 27/95  bone]
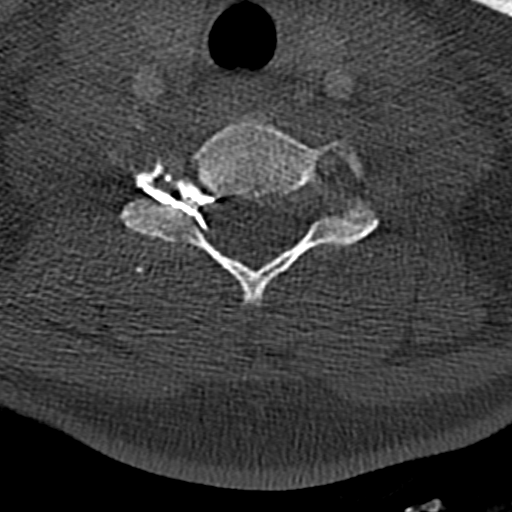
[im 54/95  bone]
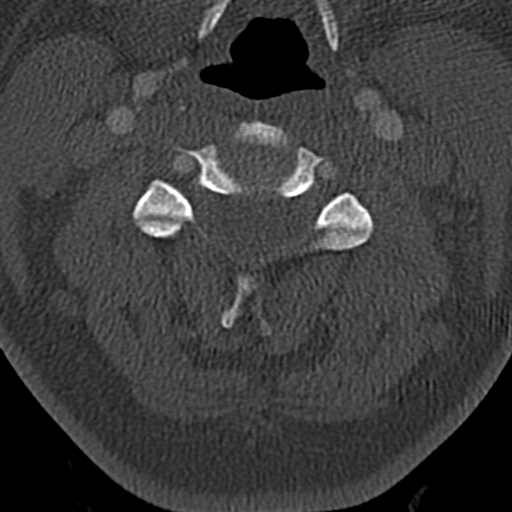
[im 68/95  bone]
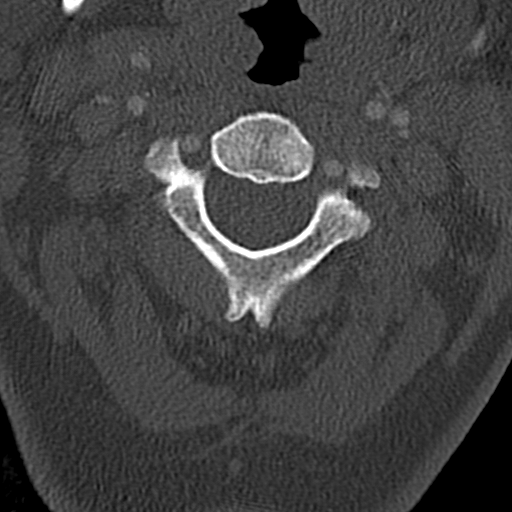
[im 81/95  soft-tissue]
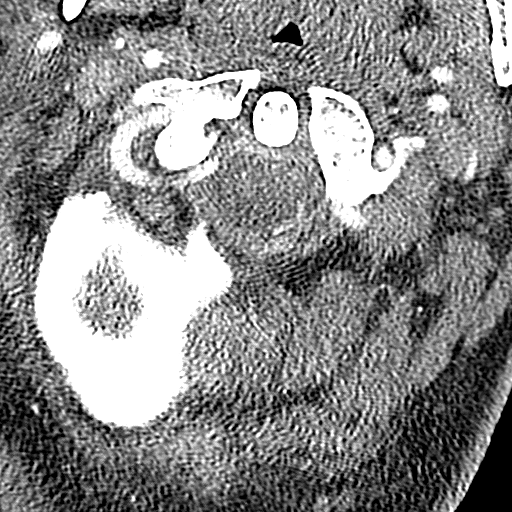
[im 81/95  bone]
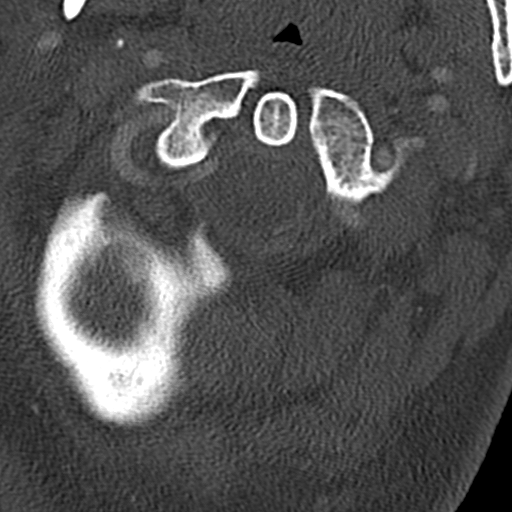

[13 of 33 positions shown; findings below may reference images not displayed]

FINDINGS: Alignment: No static subluxation. Facets are aligned. Occipital
condyles and the lateral masses of C1 and C2 are normally
approximated.

Skull base and vertebrae: No acute fracture.

Soft tissues and spinal canal: No prevertebral fluid or swelling. No
visible canal hematoma.

Disc levels: No advanced spinal canal or neural foraminal stenosis.

Upper chest: No pneumothorax, pulmonary nodule or pleural effusion.

Other: Normal visualized paraspinal cervical soft tissues.
IMPRESSION: No acute fracture or static subluxation of the cervical spine.

## 2023-07-03 ENCOUNTER — Emergency Department (HOSPITAL_BASED_OUTPATIENT_CLINIC_OR_DEPARTMENT_OTHER)
Admission: EM | Admit: 2023-07-03 | Discharge: 2023-07-03 | Disposition: A | Discharge status: Home / Self Care | Payer: Medicaid Other | Attending: Emergency Medicine | Admitting: Emergency Medicine

## 2023-07-03 ENCOUNTER — Other Ambulatory Visit: Payer: Self-pay

## 2023-07-03 DIAGNOSIS — H6122 Impacted cerumen, left ear: Secondary | ICD-10-CM | POA: Insufficient documentation

## 2023-07-03 DIAGNOSIS — R220 Localized swelling, mass and lump, head: Secondary | ICD-10-CM | POA: Insufficient documentation

## 2023-07-03 DIAGNOSIS — K118 Other diseases of salivary glands: Secondary | ICD-10-CM

## 2023-07-03 MED ORDER — AMOXICILLIN-POT CLAVULANATE 875-125 MG PO TABS: 1.0000 | ORAL_TABLET | Freq: Two times a day (BID) | ORAL | 0 refills | Status: DC

## 2023-07-03 MED ORDER — IBUPROFEN 400 MG PO TABS
400.0000 mg | ORAL_TABLET | Freq: Once | ORAL | Status: AC
  Administered 2023-07-03: 400 mg via ORAL
  Filled 2023-07-03: qty 1

## 2023-07-03 MED ORDER — AMOXICILLIN-POT CLAVULANATE 875-125 MG PO TABS
1.0000 | ORAL_TABLET | Freq: Once | ORAL | Status: AC
  Administered 2023-07-03: 1 via ORAL
  Filled 2023-07-03: qty 1

## 2023-07-03 NOTE — ED Notes (Signed)
Ear irrigated to remove wax.

## 2023-07-03 NOTE — Discharge Instructions (Signed)
It was our pleasure to provide your ER care today - we hope that you feel better.  Warm compresses to sore area. Take acetaminophen or ibuprofen as need. Try using sour drops/lozenges to encourage salivary secretions.  Take antibiotic as prescribed.  Follow up closely with ENT doctor in the coming week if symptoms fail to improve/resolve.  Return to ER if worse, new symptoms, worsening or severe swelling, trouble breathing or swallowing, high fevers, or other concern.

## 2023-07-03 NOTE — ED Triage Notes (Addendum)
Patient arrives with increased right side jaw swelling that started this morning. Patient states that she is not having dental issues or fevers. No recent trauma to face. Rates pain an 8/10. No difficulty breathing.

## 2023-07-03 NOTE — ED Notes (Signed)
Reviewed AVS/discharge instruction with patient. Time allotted for and all questions answered. Patient is agreeable for d/c and escorted to ed exit by staff.  

## 2023-07-03 NOTE — ED Provider Notes (Signed)
[place] EMERGENCY DEPARTMENT AT [facility] Provider Note   CSN: [id number] Arrival date & time: 07/03/23  1016     History  Chief Complaint  Patient presents with   Facial Swelling    Dana Snyder is a 32 y.o. female.  Pt with c/o swelling/pain to right submandibular area today. Symptoms acute onset, moderate. No hx same. No trauma to area. No dental pain. No trismus. No pain w swallowing or trouble swallowing. No sore throat. No fever or chills.  Also notes left ear full/discomfort sensation. Had tried to clean ear. No discharge from ear. No hearing loss. No headache.   The history is provided by the patient and medical records.       Home Medications Prior to Admission medications   Medication Sig Start Date End Date Taking? Authorizing Provider  acetaminophen (TYLENOL) 325 MG tablet Take 650 mg by mouth every 6 (six) hours as needed for moderate pain.    [provider]  aspirin-acetaminophen-caffeine (EXCEDRIN MIGRAINE) [phone] MG tablet Take 1 tablet by mouth every 6 (six) hours as needed for headache.    [provider]  ibuprofen (ADVIL,MOTRIN) 600 MG tablet Take 1 tablet (600 mg total) by mouth every 6 (six) hours. Patient not taking: Reported on 03/09/2019 05/16/16   Lavina Hamman, MD  levonorgestrel (MIRENA) 20 MCG/24HR IUD 1 each by Intrauterine route once.  06/04/16   [provider]  SUMAtriptan (IMITREX) 100 MG tablet Take one tablet at the onset of a migraine headache. May repeat in 2 hours if headache persists or recurs for one dose. Maximum 2 doses in 24 hours. 03/29/21   Elpidio Anis, PA-C      Allergies    Darvocet [propoxyphene n-acetaminophen]    Review of Systems   Review of Systems  Constitutional:  Negative for chills and fever.  HENT:  Negative for dental problem, sore throat and trouble swallowing.   Eyes:  Negative for redness.  Respiratory:  Negative for cough.   Gastrointestinal:  Negative for  nausea and vomiting.  Musculoskeletal:  Negative for neck stiffness.  Skin:  Negative for rash.  Neurological:  Negative for headaches.  Psychiatric/Behavioral:  Negative for confusion.     Physical Exam Updated Vital Signs BP 134/82 (BP Location: Right Arm)   Pulse 85   Temp 98.5 F (36.9 C)   Resp 17   Ht 1.626 m (5\' 4" )   Wt 95.3 kg   SpO2 100%   BMI 36.05 kg/m  Physical Exam Vitals and nursing note reviewed.  Constitutional:      Appearance: Normal appearance. She is well-developed.  HENT:     Head: Atraumatic.     Right Ear: Tympanic membrane and ear canal normal.     Left Ear: There is impacted cerumen.     Nose: Nose normal.     Mouth/Throat:     Mouth: Mucous membranes are moist.     Pharynx: Oropharynx is clear. No oropharyngeal exudate or posterior oropharyngeal erythema.     Comments: No trismus. No acute dental decay, gum swelling, or abscess noted. Pharynx normal. No swelling, pain or tenderness to floor of mouth or anterior neck. No purulent drainage at submandibular duct opening.  Eyes:     General: No scleral icterus.    Conjunctiva/sclera: Conjunctivae normal.     Pupils: Pupils are equal, round, and reactive to light.  Neck:     Trachea: No tracheal deviation.     Comments: Trachea midline. Right submandibular swelling  and tenderness. No fluctuance/abscess.  Cardiovascular:     Rate and Rhythm: Normal rate and regular rhythm.     Pulses: Normal pulses.     Heart sounds: Normal heart sounds. No murmur heard.    No friction rub. No gallop.  Pulmonary:     Effort: Pulmonary effort is normal. No respiratory distress.     Breath sounds: Normal breath sounds.  Abdominal:     General: Bowel sounds are normal. There is no distension.     Palpations: Abdomen is soft.     Tenderness: There is no abdominal tenderness. There is no guarding.  Genitourinary:    Comments: No cva tenderness.  Musculoskeletal:        General: No swelling.     Cervical back:  Normal range of motion and neck supple. No rigidity. No muscular tenderness.  Lymphadenopathy:     Cervical: No cervical adenopathy.  Skin:    General: Skin is warm and dry.     Findings: No rash.  Neurological:     Mental Status: She is alert.     Comments: Alert, speech normal.   Psychiatric:        Mood and Affect: Mood normal.     ED Results / Procedures / Treatments   Labs (all labs ordered are listed, but only abnormal results are displayed) Labs Reviewed - No data to display  EKG None  Radiology No results found.  Procedures Procedures    Medications Ordered in ED Medications  amoxicillin-clavulanate (AUGMENTIN) 875-125 MG per tablet 1 tablet (1 tablet Oral Given 07/03/23 1133)  ibuprofen (ADVIL) tablet 400 mg (400 mg Oral Given 07/03/23 1133)    ED Course/ Medical Decision Making/ A&P                                 Medical Decision Making Problems Addressed: Impacted cerumen, left ear: acute illness or injury Submandibular gland tenderness: acute illness or injury  Amount and/or Complexity of Data Reviewed External Data Reviewed: notes.  Risk Prescription drug management.   Confirmed nkda.   Reviewed nursing notes and prior charts for additional history.   Augmentin po. Motrin po.   Nursing staff to irrigate right ear/cerumen impaction.   Post irrigation eac/tm looks good. Tm intact. No infection. Symptoms resolved.   Rx sent to pharmacy.  Return precautions provided.          Final Clinical Impression(s) / ED Diagnoses Final diagnoses:  None    Rx / DC Orders ED Discharge Orders     None         Cathren Laine, MD 07/03/23 1210

## 2023-07-11 ENCOUNTER — Encounter (HOSPITAL_BASED_OUTPATIENT_CLINIC_OR_DEPARTMENT_OTHER): Payer: Self-pay

## 2023-07-11 ENCOUNTER — Other Ambulatory Visit: Payer: Self-pay

## 2023-07-11 ENCOUNTER — Emergency Department (HOSPITAL_BASED_OUTPATIENT_CLINIC_OR_DEPARTMENT_OTHER)
Admission: EM | Admit: 2023-07-11 | Discharge: 2023-07-11 | Disposition: A | Discharge status: Home / Self Care | Payer: Medicaid Other | Attending: Emergency Medicine | Admitting: Emergency Medicine

## 2023-07-11 DIAGNOSIS — R21 Rash and other nonspecific skin eruption: Secondary | ICD-10-CM | POA: Insufficient documentation

## 2023-07-11 DIAGNOSIS — Z7982 Long term (current) use of aspirin: Secondary | ICD-10-CM | POA: Insufficient documentation

## 2023-07-11 DIAGNOSIS — T360X5A Adverse effect of penicillins, initial encounter: Secondary | ICD-10-CM | POA: Insufficient documentation

## 2023-07-11 DIAGNOSIS — T50905A Adverse effect of unspecified drugs, medicaments and biological substances, initial encounter: Secondary | ICD-10-CM

## 2023-07-11 MED ORDER — PREDNISONE 10 MG PO TABS: 20.0000 mg | ORAL_TABLET | Freq: Every day | ORAL | 0 refills | Status: DC

## 2023-07-11 NOTE — ED Notes (Signed)
Discharge instructions, prescription, allergy meds, and follow up care reviewed and explained, pt verbalized understanding and had no further questions on d/c. Pt caox4, ambulatory, NAD on d/c.

## 2023-07-11 NOTE — Discharge Instructions (Addendum)
Please follow-up with the primary care provider I have attached your for you today.  It appears on exam you are having allergic reaction to the amoxicillin and so please stop taking the amoxicillin and follow-up with your primary care provider.  I have prescribed for you steroids to take for the next few days to help with your rash.  You may use Claritin/Zyrtec daily to help with your symptoms along with Benadryl at night as needed.  If symptoms change or worsen please return to ER.

## 2023-07-11 NOTE — ED Provider Notes (Signed)
[place] EMERGENCY DEPARTMENT AT [facility] Provider Note   CSN: [id number] Arrival date & time: 07/11/23  1350     History  Chief Complaint  Patient presents with   Rash    Dana Snyder is a 31 y.o. female presented for a rash that began 3 days ago on her upper extremities and upper torso.  Patient notes that last week she was seen for right-sided facial swelling and was given amoxicillin and seen after taking amoxicillin symptoms began.  Patient denies chest pain, shortness of breath, wheezing, inability to swallow, fevers.  Patient tried Benadryl and steroid cream to no relief.  Patient states that the rash is very itchy.   Patient denied new lotions/linens/clothes/soaps/detergents, abdominal pain, nausea/vomiting  Home Medications Prior to Admission medications   Medication Sig Start Date End Date Taking? Authorizing Provider  predniSONE (DELTASONE) 10 MG tablet Take 2 tablets (20 mg total) by mouth daily. 07/11/23  Yes Netta Corrigan, PA-C  acetaminophen (TYLENOL) 325 MG tablet Take 650 mg by mouth every 6 (six) hours as needed for moderate pain.    [provider]  amoxicillin-clavulanate (AUGMENTIN) 875-125 MG tablet Take 1 tablet by mouth every 12 (twelve) hours. 07/03/23   Cathren Laine, MD  aspirin-acetaminophen-caffeine (EXCEDRIN MIGRAINE) [phone] MG tablet Take 1 tablet by mouth every 6 (six) hours as needed for headache.    [provider]  ibuprofen (ADVIL,MOTRIN) 600 MG tablet Take 1 tablet (600 mg total) by mouth every 6 (six) hours. Patient not taking: Reported on 03/09/2019 05/16/16   Lavina Hamman, MD  levonorgestrel (MIRENA) 20 MCG/24HR IUD 1 each by Intrauterine route once.  06/04/16   [provider]  SUMAtriptan (IMITREX) 100 MG tablet Take one tablet at the onset of a migraine headache. May repeat in 2 hours if headache persists or recurs for one dose. Maximum 2 doses in 24 hours. 03/29/21   Elpidio Anis, PA-C       Allergies    Darvocet [propoxyphene n-acetaminophen]    Review of Systems   Review of Systems  Skin:  Positive for rash.    Physical Exam Updated Vital Signs BP 111/78 (BP Location: Right Arm)   Pulse 81   Temp 98.9 F (37.2 C) (Oral)   Resp 20   Ht 5\' 4"  (1.626 m)   Wt 90.7 kg   LMP 06/27/2023   SpO2 97%   BMI 34.33 kg/m  Physical Exam Constitutional:      General: She is not in acute distress.    Comments: Sitting comfortably on her phone  HENT:     Mouth/Throat:     Comments: No mucosal involvement of the rash Cardiovascular:     Rate and Rhythm: Normal rate and regular rhythm.     Pulses: Normal pulses.     Heart sounds: Normal heart sounds.  Pulmonary:     Effort: Pulmonary effort is normal. No respiratory distress.     Breath sounds: Normal breath sounds.  Abdominal:     Palpations: Abdomen is soft.     Tenderness: There is no abdominal tenderness. There is no guarding or rebound.  Skin:    General: Skin is warm and dry.     Comments: Urticaria noted in the upper extremities and torso Pruritic however nontender to palpation  Neurological:     Mental Status: She is alert.     ED Results / Procedures / Treatments   Labs (all labs ordered are listed, but only abnormal results are displayed)  Labs Reviewed - No data to display  EKG None  Radiology No results found.  Procedures Procedures    Medications Ordered in ED Medications - No data to display  ED Course/ Medical Decision Making/ A&P                                 Medical Decision Making Risk Prescription drug management.   Dana Snyder 33 y.o. presented today for rash. Working DDx that I considered at this time includes, but not limited to, contact dermatitis, SJS/TEN, DRESS syndrome, allergic reaction, shingles, chickenpox, eczema, candidiasis.  R/o DDx: contact dermatitis, SJS/TEN, DRESS syndrome, shingles, chickenpox, eczema, candidiasis: These are considered less likely  due to history of present illness, physical exam, labs/imaging findings.  Review of prior external notes: 07/03/2023 ED  Unique Tests and My Interpretation: None  Discussion with Independent Historian: None  Discussion of Management of Tests: None  Risk: Medium: prescription drug management  Risk Stratification Score: None  Plan: On exam patient was in no acute distress with stable vitals.  Patient did have a urticarial rash that is pruritic in nature that began after taking the amoxicillin and so have high suspicion rash is secondary to the amoxicillin.  Patient is not showing concerning features and had reassuring physical exam.  I spoke to the patient we agreed to discontinue the amoxicillin as she has been on the antibiotic for the past 8 days and after reviewing the previous provider's note does not appear that there is signs of infection as this is more prophylactic.  Patient stated that the swelling in her face is gone down since the antibiotics have been given and on exam patient not showing concerning features of progression.  Patient was given steroid pack and primary care follow-up along with work note.  Patient educated on signs of anaphylaxis encouraged to return to ER symptoms are change or worsen.  Encourage patient to use antihistamines over-the-counter and Benadryl at night as needed.  Patient was given return precautions. Patient stable for discharge at this time.  Patient verbalized understanding of plan.  This chart was dictated using voice recognition software.  Despite best efforts to proofread,  errors can occur which can change the documentation meaning.         Final Clinical Impression(s) / ED Diagnoses Final diagnoses:  Medication side effect, initial encounter    Rx / DC Orders ED Discharge Orders          Ordered    predniSONE (DELTASONE) 10 MG tablet  Daily        07/11/23 1504              Netta Corrigan, PA-C 07/11/23 1511     Virgina Norfolk, DO 07/11/23 1615

## 2023-07-11 NOTE — ED Triage Notes (Signed)
Pt presents with red rash to trunk and extremities that appeared after working in the yard 3 days ago. Pt has taken Benadryl and Zyrtec without relief.

## 2023-07-16 ENCOUNTER — Encounter (HOSPITAL_BASED_OUTPATIENT_CLINIC_OR_DEPARTMENT_OTHER): Payer: Self-pay | Admitting: Emergency Medicine

## 2023-07-16 ENCOUNTER — Emergency Department (HOSPITAL_BASED_OUTPATIENT_CLINIC_OR_DEPARTMENT_OTHER)
Admission: EM | Admit: 2023-07-16 | Discharge: 2023-07-16 | Disposition: A | Discharge status: Home / Self Care | Payer: Medicaid Other | Attending: Emergency Medicine | Admitting: Emergency Medicine

## 2023-07-16 ENCOUNTER — Other Ambulatory Visit: Payer: Self-pay

## 2023-07-16 DIAGNOSIS — L270 Generalized skin eruption due to drugs and medicaments taken internally: Secondary | ICD-10-CM

## 2023-07-16 DIAGNOSIS — L233 Allergic contact dermatitis due to drugs in contact with skin: Secondary | ICD-10-CM | POA: Insufficient documentation

## 2023-07-16 MED ORDER — PREDNISONE 10 MG (21) PO TBPK: ORAL_TABLET | Freq: Every day | ORAL | 0 refills | Status: DC

## 2023-07-16 MED ORDER — TRIAMCINOLONE ACETONIDE 0.1 % EX OINT: 1.0000 | TOPICAL_OINTMENT | Freq: Two times a day (BID) | CUTANEOUS | 0 refills | Status: AC

## 2023-07-16 NOTE — ED Provider Notes (Signed)
[place] EMERGENCY DEPARTMENT AT [facility] Provider Note   CSN: [id number] Arrival date & time: 07/16/23  1336     History  Chief Complaint  Patient presents with   Rash    Dana Snyder is a 32 y.o. female, history of eczema, who presents to the ED secondary to rash has been going on for the past week.  She states that about 2 weeks ago, she was here for a swollen lymph node, she was prescribed Augmentin, and had an adverse reaction.  She started developing a rash, and swelling of her eye, she went back to the ER and was ryes prescribed low-dose prednisone 20 mg, which she states helped with the swelling of the face, but now her rash has been persistent and worsening.  She reports is itchy.  Denies any new soaps, foods, detergents.  Denies any kind of exposure to poison ivy, or blisters on her skin.  States it is painful, and around her elbows, abdomen, chest, and part of her face.  Denies any facial swelling, since the initial facial swelling, as well as no sore throat, fevers, blood from the vaginal mucosa, or anal mucosa, or mouth.    Home Medications Prior to Admission medications   Medication Sig Start Date End Date Taking? Authorizing Provider  predniSONE (STERAPRED UNI-PAK 21 TAB) 10 MG (21) TBPK tablet Take by mouth daily. Take 6 tabs by mouth daily  for 2 days, then 5 tabs for 2 days, then 4 tabs for 2 days, then 3 tabs for 2 days, 2 tabs for 2 days, then 1 tab by mouth daily for 2 days 07/16/23  Yes Aneyah Lortz L, PA  triamcinolone ointment (KENALOG) 0.1 % Apply 1 Application topically 2 (two) times daily for 10 days. 07/16/23 07/26/23 Yes Cheryl Stabenow L, PA  acetaminophen (TYLENOL) 325 MG tablet Take 650 mg by mouth every 6 (six) hours as needed for moderate pain.    [provider]  amoxicillin-clavulanate (AUGMENTIN) 875-125 MG tablet Take 1 tablet by mouth every 12 (twelve) hours. 07/03/23   Cathren Laine, MD  aspirin-acetaminophen-caffeine (EXCEDRIN  MIGRAINE) [phone] MG tablet Take 1 tablet by mouth every 6 (six) hours as needed for headache.    [provider]  ibuprofen (ADVIL,MOTRIN) 600 MG tablet Take 1 tablet (600 mg total) by mouth every 6 (six) hours. Patient not taking: Reported on 03/09/2019 05/16/16   Lavina Hamman, MD  levonorgestrel (MIRENA) 20 MCG/24HR IUD 1 each by Intrauterine route once.  06/04/16   [provider]  SUMAtriptan (IMITREX) 100 MG tablet Take one tablet at the onset of a migraine headache. May repeat in 2 hours if headache persists or recurs for one dose. Maximum 2 doses in 24 hours. 03/29/21   Elpidio Anis, PA-C      Allergies    Amoxicillin and Darvocet [propoxyphene n-acetaminophen]    Review of Systems   Review of Systems  Constitutional:  Negative for fever.  Skin:  Positive for rash.    Physical Exam Updated Vital Signs BP 104/71 (BP Location: Left Arm)   Pulse 100   Temp 97.8 F (36.6 C) (Oral)   Resp (!) 28   Ht 5\' 4"  (1.626 m)   Wt 90.7 kg   LMP 07/15/2023 (Exact Date)   SpO2 93%   BMI 34.32 kg/m  Physical Exam Vitals and nursing note reviewed.  Constitutional:      General: She is not in acute distress.    Appearance: She is well-developed.  HENT:     Head: Normocephalic and atraumatic.  Eyes:     Conjunctiva/sclera: Conjunctivae normal.  Cardiovascular:     Rate and Rhythm: Normal rate and regular rhythm.     Heart sounds: No murmur heard. Pulmonary:     Effort: Pulmonary effort is normal. No respiratory distress.     Breath sounds: Normal breath sounds.  Abdominal:     Palpations: Abdomen is soft.     Tenderness: There is no abdominal tenderness.  Musculoskeletal:        General: No swelling.     Cervical back: Neck supple.  Skin:    General: Skin is warm and dry.     Capillary Refill: Capillary refill takes less than 2 seconds.     Comments: Erythematous patches, to bilateral arms, chest, abdomen.  No crusting, vesicular lesions, bleeding, or  excoriation.  Mild erythematous patch, and bilateral cheeks, minimal compared to trunk.  No mucosal bleeding.  Neurological:     Mental Status: She is alert.  Psychiatric:        Mood and Affect: Mood normal.      ED Results / Procedures / Treatments   Labs (all labs ordered are listed, but only abnormal results are displayed) Labs Reviewed - No data to display  EKG None  Radiology No results found.  Procedures Procedures    Medications Ordered in ED Medications - No data to display  ED Course/ Medical Decision Making/ A&P                                 Medical Decision Making Patient is a 32 year old female, here for rash, bilateral arms, trunk, partially face, that occurred after she had a dose of amoxicillin, for some swelling of her face.  She has been on low-dose prednisone, with some improvement of the swelling of her face, but none on her body.  She states that she does not have any mucosal lesions, no shortness of breath, wheezing, difficulty swallowing.  She has not had any new foods, detergents, or soaps.  Believe this is likely just extended reaction to the drug rash, she does not have any mucosal lesions, to suggest SJS.  Overall well-appearing.  It is itchy.  No vesicular lesion.  We will put her on a higher dose of steroids, as I believe the 20 mg was too low especially given her body habitus.  Additionally will prescribe her some triamcinolone, for areas for the rash.  I discussed not using this on her face, or dcolletage as this may cause thinning of the skin.  We discussed return precautions, and follow-up with dermatology, she voiced understanding    Final Clinical Impression(s) / ED Diagnoses Final diagnoses:  Allergic drug rash    Rx / DC Orders ED Discharge Orders          Ordered    predniSONE (STERAPRED UNI-PAK 21 TAB) 10 MG (21) TBPK tablet  Daily        07/16/23 1436    triamcinolone ointment (KENALOG) 0.1 %  2 times daily        07/16/23  1436              Armstrong Creasy, [place], [place] 07/16/23 1451    Margarita Grizzle, MD 07/21/23 1227

## 2023-07-16 NOTE — Discharge Instructions (Addendum)
I believe that your rash is likely secondary to drug reaction, you likely had a viral infection, and had a drug reaction from the amoxicillin.  Please take the steroids as prescribed, stop taking the 20 mg, and take the high-dose instead, you can use the triamcinolone, on the areas of the rash, but be careful and do not use it on your face.  Use it sparingly on your chest.  Long-term use of steroids can cause thinning of your skin.  It should be okay for the short-term.  Please follow-up with your dermatologist.  Return to the ER immediately if you start having any kind of bleeding of the mouth, rectum, or mucosal lesions of your vagina.

## 2023-07-16 NOTE — ED Triage Notes (Signed)
Pt via pov from home with continuing and worsening rash. She was seen 2 weeks ago and given prescription for amoxicillin; began having rash one week ago; came back and was given prednisone. She is still taking it (one day left) but the rash is becoming worse. Pt alert & oriented, nad noted.

## 2024-09-01 ENCOUNTER — Emergency Department (HOSPITAL_BASED_OUTPATIENT_CLINIC_OR_DEPARTMENT_OTHER)
Admission: EM | Admit: 2024-09-01 | Discharge: 2024-09-02 | Disposition: A | Discharge status: Home / Self Care | Payer: Self-pay

## 2024-09-01 ENCOUNTER — Other Ambulatory Visit: Payer: Self-pay

## 2024-09-01 DIAGNOSIS — R519 Headache, unspecified: Secondary | ICD-10-CM | POA: Insufficient documentation

## 2024-09-01 LAB — CBC WITH DIFFERENTIAL/PLATELET
Abs Immature Granulocytes: 0.04 K/uL (ref 0.00–0.07)
Basophils Absolute: 0 K/uL (ref 0.0–0.1)
Basophils Relative: 0 %
Eosinophils Absolute: 0 K/uL (ref 0.0–0.5)
Eosinophils Relative: 0 %
HCT: 41.1 % (ref 36.0–46.0)
Hemoglobin: 13.6 g/dL (ref 12.0–15.0)
Immature Granulocytes: 1 %
Lymphocytes Relative: 9 %
Lymphs Abs: 0.8 K/uL (ref 0.7–4.0)
MCH: 29.5 pg (ref 26.0–34.0)
MCHC: 33.1 g/dL (ref 30.0–36.0)
MCV: 89.2 fL (ref 80.0–100.0)
Monocytes Absolute: 0.5 K/uL (ref 0.1–1.0)
Monocytes Relative: 6 %
Neutro Abs: 7.5 K/uL (ref 1.7–7.7)
Neutrophils Relative %: 84 %
Platelets: 333 K/uL (ref 150–400)
RBC: 4.61 MIL/uL (ref 3.87–5.11)
RDW: 12.1 % (ref 11.5–15.5)
WBC: 8.8 K/uL (ref 4.0–10.5)
nRBC: 0 % (ref 0.0–0.2)

## 2024-09-01 LAB — COMPREHENSIVE METABOLIC PANEL WITH GFR
ALT: 23 U/L (ref 0–44)
AST: 17 U/L (ref 15–41)
Albumin: 4.3 g/dL (ref 3.5–5.0)
Alkaline Phosphatase: 50 U/L (ref 38–126)
Anion gap: 13 (ref 5–15)
BUN: 14 mg/dL (ref 6–20)
CO2: 24 mmol/L (ref 22–32)
Calcium: 9.6 mg/dL (ref 8.9–10.3)
Chloride: 101 mmol/L (ref 98–111)
Creatinine, Ser: 1.06 mg/dL — ABNORMAL HIGH (ref 0.44–1.00)
GFR, Estimated: >60 mL/min (ref >60)
Glucose, Bld: 110 mg/dL — ABNORMAL HIGH (ref 70–99)
Potassium: 4.2 mmol/L (ref 3.5–5.1)
Sodium: 139 mmol/L (ref 135–145)
Total Bilirubin: 0.8 mg/dL (ref 0.0–1.2)
Total Protein: 7.8 g/dL (ref 6.5–8.1)

## 2024-09-01 LAB — HCG, SERUM, QUALITATIVE: Preg, Serum: NEGATIVE

## 2024-09-01 MED ORDER — PROCHLORPERAZINE EDISYLATE 10 MG/2ML IJ SOLN
10.0000 mg | Freq: Once | INTRAMUSCULAR | Status: AC
  Administered 2024-09-01: 10 mg via INTRAVENOUS
  Filled 2024-09-01: qty 2

## 2024-09-01 MED ORDER — DEXAMETHASONE SOD PHOSPHATE PF 10 MG/ML IJ SOLN
10.0000 mg | Freq: Once | INTRAMUSCULAR | Status: AC
  Administered 2024-09-01: 10 mg via INTRAVENOUS

## 2024-09-01 MED ORDER — DIPHENHYDRAMINE HCL 50 MG/ML IJ SOLN
25.0000 mg | Freq: Once | INTRAMUSCULAR | Status: AC
  Administered 2024-09-01: 25 mg via INTRAVENOUS
  Filled 2024-09-01: qty 1

## 2024-09-01 MED ORDER — SODIUM CHLORIDE 0.9 % IV BOLUS
1000.0000 mL | Freq: Once | INTRAVENOUS | Status: AC
  Administered 2024-09-01: 1000 mL via INTRAVENOUS

## 2024-09-01 NOTE — ED Triage Notes (Signed)
 Pt via pov from home with migraine x 3-4 days. Pt reports today has been worse: nauseous, dizzy, feeling like she is going to pass out, photosensitivity, stiff neck and back. Pt gets migraines periodically but they usually go away. This time is a longer duration. Pt a&o x 4; nad noted.

## 2024-09-01 NOTE — Discharge Instructions (Signed)
 I have placed a referral to neurology so you can follow-up with the specialist for recurrent headache. Lab work is overall reassuring.  Return to emergency room with new or worsening symptoms.

## 2024-09-01 NOTE — ED Provider Notes (Signed)
 [place] EMERGENCY DEPARTMENT AT [facility] Provider Note   CSN: [id number] Arrival date & time: 09/01/24  1820     Patient presents with: Migraine   Dana Snyder is a 33 y.o. female patient with past medical history of migraines presents to emergency room with complaint of migraine.  Patient reports that she has had headache for the last 3 days it seems to be constant.  It is associated with sensitivity to light, nausea and 1 episode of vomiting.  Patient reports this feels like her typical migraine however it will not go away and has not significantly improved with Excedrin.  She denies any injury trauma or fall.  She denies vision change, confusion, slurred speech or numbness and tingling in extremities.  She denies any sudden onset of severe headache.  She denies any fever or neck stiffness or rigidity.    Migraine Associated symptoms include headaches.       Prior to Admission medications   Medication Sig Start Date End Date Taking? Authorizing Provider  acetaminophen  (TYLENOL ) 325 MG tablet Take 650 mg by mouth every 6 (six) hours as needed for moderate pain.    [provider]  amoxicillin -clavulanate (AUGMENTIN ) 875-125 MG tablet Take 1 tablet by mouth every 12 (twelve) hours. 07/03/23   Steinl, Kevin, MD  aspirin-acetaminophen -caffeine (EXCEDRIN MIGRAINE) 250-250-65 MG tablet Take 1 tablet by mouth every 6 (six) hours as needed for headache.    [provider]  ibuprofen  (ADVIL ,MOTRIN ) 600 MG tablet Take 1 tablet (600 mg total) by mouth every 6 (six) hours. Patient not taking: Reported on 03/09/2019 05/16/16   Horacio Boas, MD  levonorgestrel (MIRENA) 20 MCG/24HR IUD 1 each by Intrauterine route once.  06/04/16   [provider]  predniSONE  (STERAPRED UNI-PAK 21 TAB) 10 MG (21) TBPK tablet Take by mouth daily. Take 6 tabs by mouth daily  for 2 days, then 5 tabs for 2 days, then 4 tabs for 2 days, then 3 tabs for 2 days, 2 tabs for 2  days, then 1 tab by mouth daily for 2 days 07/16/23   Small, Brooke L, PA  SUMAtriptan  (IMITREX ) 100 MG tablet Take one tablet at the onset of a migraine headache. May repeat in 2 hours if headache persists or recurs for one dose. Maximum 2 doses in 24 hours. 03/29/21   Odell Balls, PA-C    Allergies: Amoxicillin  and Darvocet [propoxyphene n-acetaminophen ]    Review of Systems  Neurological:  Positive for headaches.    Updated Vital Signs BP 103/84   Pulse 70   Temp 98.4 F (36.9 C) (Oral)   Resp 18   Ht 5' 4 (1.626 m)   Wt 90.7 kg   LMP 08/27/2024 (Exact Date)   SpO2 95%   Breastfeeding No   BMI 34.32 kg/m   Physical Exam Vitals and nursing note reviewed.  Constitutional:      General: She is not in acute distress.    Appearance: She is not toxic-appearing.  HENT:     Head: Normocephalic and atraumatic.  Eyes:     General: No scleral icterus.    Conjunctiva/sclera: Conjunctivae normal.  Cardiovascular:     Rate and Rhythm: Normal rate and regular rhythm.     Pulses: Normal pulses.     Heart sounds: Normal heart sounds.  Pulmonary:     Effort: Pulmonary effort is normal. No respiratory distress.     Breath sounds: Normal breath sounds.  Abdominal:     General: Abdomen is  flat. Bowel sounds are normal.     Palpations: Abdomen is soft.     Tenderness: There is no abdominal tenderness.  Skin:    General: Skin is warm and dry.     Findings: No lesion.  Neurological:     General: No focal deficit present.     Mental Status: She is alert and oriented to person, place, and time. Mental status is at baseline.     Comments: Patient is alert and oriented answering questions appropriately with no slurred speech, no facial droop.  Pupils equal and reactive with no nystagmus.  Upper extremity sensation and strength intact.  No ataxia with finger-to-nose.  Lower extremity sensation and strength intact.     (all labs ordered are listed, but only abnormal results are  displayed) Labs Reviewed  COMPREHENSIVE METABOLIC PANEL WITH GFR - Abnormal; Notable for the following components:      Result Value   Glucose, Bld 110 (*)    Creatinine, Ser 1.06 (*)    All other components within normal limits  CBC WITH DIFFERENTIAL/PLATELET  HCG, SERUM, QUALITATIVE    EKG: None  Radiology: No results found.   Procedures   Medications Ordered in the ED  dexamethasone  (DECADRON ) injection 10 mg (10 mg Intravenous Given 09/01/24 2256)  prochlorperazine  (COMPAZINE ) injection 10 mg (10 mg Intravenous Given 09/01/24 2252)  diphenhydrAMINE  (BENADRYL ) injection 25 mg (25 mg Intravenous Given 09/01/24 2250)  sodium chloride  0.9 % bolus 1,000 mL (1,000 mLs Intravenous New Bag/Given 09/01/24 2249)    Clinical Course as of 09/01/24 2336  Wed Sep 01, 2024  2331 Patient was reassessed and she reports she is feeling better.  She is requesting to be discharged home. [JB]    Clinical Course User Index [JB] Aviyon Hocevar, Warren SAILOR, PA-C                                 Medical Decision Making Amount and/or Complexity of Data Reviewed Labs: ordered.  Risk Prescription drug management.   This patient presents to the ED for concern of headache, this involves an extensive number of treatment options, and is a complaint that carries with it a high risk of complications and morbidity.  The differential diagnosis includes tension headache, migraine, intracranial mass, intracranial hemorrhage, intracranial infection including meningitis vs encephalitis, trigeminal neuralgia, AVM, sinusitis, cerebral aneurysm, muscular headache, cavernous sinus thrombosis, carotid artery dissection.   Lab Tests:  I personally interpreted labs.  The pertinent results include:   CBC, CMP unremarkable   Imaging Studies ordered:  Considered however she has her typical migraine pattern, no red flag symptoms associate with headache and no focal deficits so deferred after shared decision  making.   Problem List / ED Course / Critical interventions / Medication management  Patient presents to emergency room with complaint of headache.  She reports this feels like her typical pattern and denies it being sudden onset nor severe.  She is able to ambulate steady gait and she has no focal neurological deficit.  She has no meningismus on exam and no fever.  I did check basic labs here including CBC which shows no leukocytosis and no anemia.  Her CMP is unremarkable.  I did treat with migraine cocktail and normal saline and patient reports that her symptoms have significantly improved. Did review patient's chart she has had head CT in the past x 2 as well as CT angio 03/30/2021 which was unremarkable  at that time. Patient is not currently established with neurology, I will place a referral to neurology for recurrent headaches/migraine. I ordered medication including migraine cocktail given for headache  Reevaluation of the patient after these medicines showed that the patient improved I have reviewed the patients home medicines and have made adjustments as needed Given reassuring workup and improvement after migraine cocktail I feel patient is stable for discharge with outpatient follow-up.  I will refer her to neurology for recurrent headaches.  She was given return precautions and follow-up instructions.        Final diagnoses:  Bad headache    ED Discharge Orders          Ordered    Ambulatory referral to Neurology       Comments: An appointment is requested in approximately: 4 weeks   09/01/24 2334               Shermon Warren SAILOR, PA-C 09/01/24 2336    Simon Lavonia SAILOR, MD 09/02/24 1739

## 2024-11-12 ENCOUNTER — Encounter: Payer: Self-pay | Admitting: Diagnostic Neuroimaging

## 2024-11-12 ENCOUNTER — Ambulatory Visit (INDEPENDENT_AMBULATORY_CARE_PROVIDER_SITE_OTHER): Payer: Self-pay | Admitting: Diagnostic Neuroimaging

## 2024-11-12 VITALS — BP 115/81 | HR 91 | Ht 64.0 in | Wt 261.4 lb

## 2024-11-12 DIAGNOSIS — G43109 Migraine with aura, not intractable, without status migrainosus: Secondary | ICD-10-CM

## 2024-11-12 MED ORDER — RIZATRIPTAN BENZOATE 10 MG PO TBDP: 10.0000 mg | ORAL_TABLET | ORAL | 11 refills | Status: AC | PRN

## 2024-11-12 MED ORDER — TOPIRAMATE 50 MG PO TABS: 50.0000 mg | ORAL_TABLET | Freq: Two times a day (BID) | ORAL | 12 refills | Status: AC

## 2024-11-12 NOTE — Progress Notes (Signed)
 "  [facility]  PATIENT: Dana Snyder DOB: 10-06-1991  REFERRING CLINICIAN: Barrett, Warren SAILOR, PA-C HISTORY FROM: patient  REASON FOR VISIT: new consult   HISTORICAL  CHIEF COMPLAINT:  Chief Complaint  Patient presents with   Establish Care    Patient in room 6 alone Patient is here for headaches, patient states as long as she can remember she's had headaches, patient states she's previously taken nurtec (maybe??) . Patient states that the headaches stopped being as intense, but in the past few years she's gotten intense migraines, ranging from being able to take Excedrin migraine, others lasting 5 days or more. Patient states she will often wind up in the er for the migraine cocktail.      HISTORY OF PRESENT ILLNESS:   34 year old female here for evaluation of headaches.  Has had headaches since age 13 years old.  She describes exploding sensation, global pain and sensitivity to light, sensitive to sound, nausea or vomiting.  Sometimes she sees spots and dots.  Sometimes she feels aura warning before the headaches are.  Averaging 2 to 7 days of headache per month.  Headaches can last up to 2 to 3 days at a time.  No specific triggering factors other than stress.   REVIEW OF SYSTEMS: Full 14 system review of systems performed and negative with exception of: as per HPI.  ALLERGIES: Allergies[1]  HOME MEDICATIONS: Outpatient Medications Prior to Visit  Medication Sig Dispense Refill   acetaminophen  (TYLENOL ) 325 MG tablet Take 650 mg by mouth every 6 (six) hours as needed for moderate pain.     aspirin-acetaminophen -caffeine (EXCEDRIN MIGRAINE) 250-250-65 MG tablet Take 1 tablet by mouth every 6 (six) hours as needed for headache.     ibuprofen  (ADVIL ,MOTRIN ) 600 MG tablet Take 1 tablet (600 mg total) by mouth every 6 (six) hours. 30 tablet 0   levonorgestrel (MIRENA) 20 MCG/24HR IUD 1 each by Intrauterine route once.      amoxicillin -clavulanate  (AUGMENTIN ) 875-125 MG tablet Take 1 tablet by mouth every 12 (twelve) hours. (Patient not taking: Reported on 11/12/2024) 14 tablet 0   predniSONE  (STERAPRED UNI-PAK 21 TAB) 10 MG (21) TBPK tablet Take by mouth daily. Take 6 tabs by mouth daily  for 2 days, then 5 tabs for 2 days, then 4 tabs for 2 days, then 3 tabs for 2 days, 2 tabs for 2 days, then 1 tab by mouth daily for 2 days (Patient not taking: Reported on 11/12/2024) 42 tablet 0   SUMAtriptan  (IMITREX ) 100 MG tablet Take one tablet at the onset of a migraine headache. May repeat in 2 hours if headache persists or recurs for one dose. Maximum 2 doses in 24 hours. (Patient not taking: Reported on 11/12/2024) 10 tablet 0   No facility-administered medications prior to visit.    PAST MEDICAL HISTORY: Past Medical History:  Diagnosis Date   Encounter for trial of labor 05/13/2016   History of shingles    Kidney infection    Migraines    Vaginal Pap smear, abnormal    VBAC, delivered, current hospitalization 05/14/2016    PAST SURGICAL HISTORY: Past Surgical History:  Procedure Laterality Date   CESAREAN SECTION  2011   KNEE SURGERY  2009   WRIST SURGERY  2006   cyst removed    FAMILY HISTORY: Family History  Problem Relation Age of Onset   Hypertension Mother    Diabetes Father    Hypertension Father    Stroke Paternal Grandmother  SOCIAL HISTORY: Social History   Socioeconomic History   Marital status: Legally Separated    Spouse name: Not on file   Number of children: Not on file   Years of education: Not on file   Highest education level: Not on file  Occupational History   Not on file  Tobacco Use   Smoking status: Former   Smokeless tobacco: Never  Vaping Use   Vaping status: Some Days  Substance and Sexual Activity   Alcohol use: Yes    Comment: socially   Drug use: No   Sexual activity: Yes    Birth control/protection: None  Other Topics Concern   Not on file  Social History Narrative   Patient  does not live alone,    Patient does not currently work.    Social Drivers of Health   Tobacco Use: Medium Risk (11/12/2024)   Patient History    Smoking Tobacco Use: Former    Smokeless Tobacco Use: Never    Passive Exposure: Not on Actuary Strain: Not on file  Food Insecurity: Not on file  Transportation Needs: Not on file  Physical Activity: Not on file  Stress: Not on file  Social Connections: Not on file  Intimate Partner Violence: Not on file  Depression (EYV7-0): Not on file  Alcohol Screen: Not on file  Housing: Not on file  Utilities: Not on file  Health Literacy: Not on file     PHYSICAL EXAM  GENERAL EXAM/CONSTITUTIONAL: Vitals:  Vitals:   11/12/24 0953  BP: 115/81  Pulse: 91  Weight: 261 lb 6.4 oz (118.6 kg)  Height: 5' 4 (1.626 m)   Body mass index is 44.87 kg/m. Wt Readings from Last 3 Encounters:  11/12/24 261 lb 6.4 oz (118.6 kg)  09/01/24 199 lb 15.3 oz (90.7 kg)  07/16/23 199 lb 15.3 oz (90.7 kg)   Patient is in no distress; well developed, nourished and groomed; neck is supple  CARDIOVASCULAR: Examination of carotid arteries is normal; no carotid bruits Regular rate and rhythm, no murmurs Examination of peripheral vascular system by observation and palpation is normal  EYES: Ophthalmoscopic exam of optic discs and posterior segments is normal; no papilledema or hemorrhages No results found.  MUSCULOSKELETAL: Gait, strength, tone, movements noted in Neurologic exam below  NEUROLOGIC: MENTAL STATUS:      No data to display         awake, alert, oriented to person, place and time recent and remote memory intact normal attention and concentration language fluent, comprehension intact, naming intact fund of knowledge appropriate  CRANIAL NERVE:  2nd - no papilledema on fundoscopic exam 2nd, 3rd, 4th, 6th - pupils equal and reactive to light, visual fields full to confrontation, extraocular muscles intact, no  nystagmus 5th - facial sensation symmetric 7th - facial strength symmetric 8th - hearing intact 9th - palate elevates symmetrically, uvula midline 11th - shoulder shrug symmetric 12th - tongue protrusion midline  MOTOR:  normal bulk and tone, full strength in the BUE, BLE  SENSORY:  normal and symmetric to light touch, temperature, vibration  COORDINATION:  finger-nose-finger, fine finger movements normal  REFLEXES:  deep tendon reflexes present and symmetric  GAIT/STATION:  narrow based gait    DIAGNOSTIC DATA (LABS, IMAGING, TESTING) - I reviewed patient records, labs, notes, testing and imaging myself where available.  Lab Results  Component Value Date   WBC 8.8 09/01/2024   HGB 13.6 09/01/2024   HCT 41.1 09/01/2024   MCV 89.2  09/01/2024   PLT 333 09/01/2024      Component Value Date/Time   NA 139 09/01/2024 2301   K 4.2 09/01/2024 2301   CL 101 09/01/2024 2301   CO2 24 09/01/2024 2301   GLUCOSE 110 (H) 09/01/2024 2301   BUN 14 09/01/2024 2301   CREATININE 1.06 (H) 09/01/2024 2301   CALCIUM 9.6 09/01/2024 2301   PROT 7.8 09/01/2024 2301   ALBUMIN 4.3 09/01/2024 2301   AST 17 09/01/2024 2301   ALT 23 09/01/2024 2301   ALKPHOS 50 09/01/2024 2301   BILITOT 0.8 09/01/2024 2301   GFRNONAA >60 09/01/2024 2301   GFRAA >60 03/08/2019 2308   No results found for: CHOL, HDL, LDLCALC, LDLDIRECT, TRIG, CHOLHDL No results found for: YHAJ8R No results found for: VITAMINB12 No results found for: TSH   03/30/21 CTA head / neck - Normal CTA of the head and neck.    ASSESSMENT AND PLAN  34 y.o. year old female here with:  Dx:  1. Migraine with aura and without status migrainosus, not intractable     PLAN:  MIGRAINE TREATMENT PLAN:  MIGRAINE PREVENTION  LIFESTYLE CHANGES -Stop or avoid smoking -Decrease or avoid caffeine / alcohol -Eat and sleep on a regular schedule -Exercise several times per week - start topiramate  50mg  at  bedtime; after 1-2 weeks increase to 50mg  twice a day; drink plenty of water  MIGRAINE RESCUE  - ibuprofen , tylenol  as needed - start rizatriptan  (Maxalt ) 10mg  as needed for breakthrough headache; may repeat x 1 after 2 hours; max 2 tabs per day or 8 per month  Meds ordered this encounter  Medications   topiramate  (TOPAMAX ) 50 MG tablet    Sig: Take 1 tablet (50 mg total) by mouth 2 (two) times daily.    Dispense:  60 tablet    Refill:  12   rizatriptan  (MAXALT -MLT) 10 MG disintegrating tablet    Sig: Take 1 tablet (10 mg total) by mouth as needed for migraine. May repeat in 2 hours if needed    Dispense:  9 tablet    Refill:  11   Return in about 6 months (around 05/12/2025) for MyChart visit (15 min).    EDUARD FABIENE HANLON, MD 11/12/2024, 10:42 AM Certified in Neurology, Neurophysiology and Neuroimaging  [facility] [address], [address] [place], [place] [address] [phone]     [1]  Allergies Allergen Reactions   Amoxicillin  Rash   Darvocet [Propoxyphene N-Acetaminophen ] Hives   "

## 2025-04-27 ENCOUNTER — Telehealth: Payer: Self-pay | Admitting: Neurology
